# Patient Record
Sex: Female | Born: 1990 | Race: Black or African American | Hispanic: Yes | Marital: Single | Smoking: Former smoker
Health system: Southern US, Community
[De-identification: ages and names within clinical notes are randomized; demographics above are authoritative.]

## PROBLEM LIST (undated history)

## (undated) DIAGNOSIS — O99340 Other mental disorders complicating pregnancy, unspecified trimester: Secondary | ICD-10-CM

## (undated) DIAGNOSIS — F329 Major depressive disorder, single episode, unspecified: Secondary | ICD-10-CM

## (undated) DIAGNOSIS — F32A Depression, unspecified: Secondary | ICD-10-CM

## (undated) DIAGNOSIS — J45909 Unspecified asthma, uncomplicated: Secondary | ICD-10-CM

## (undated) DIAGNOSIS — E669 Obesity, unspecified: Secondary | ICD-10-CM

---
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
## 2013-03-27 ENCOUNTER — Emergency Department: Payer: Self-pay | Admitting: Emergency Medicine

## 2013-03-27 LAB — URINALYSIS, COMPLETE
Bacteria: NONE SEEN
Bilirubin,UR: NEGATIVE
Glucose,UR: NEGATIVE mg/dL (ref 0–75)
Ketone: NEGATIVE
Leukocyte Esterase: NEGATIVE
Nitrite: NEGATIVE
Ph: 7 (ref 4.5–8.0)
Protein: NEGATIVE
WBC UR: 1 /HPF (ref 0–5)

## 2013-03-28 LAB — GC/CHLAMYDIA PROBE AMP

## 2013-06-04 ENCOUNTER — Emergency Department: Payer: Self-pay | Admitting: Emergency Medicine

## 2013-06-04 LAB — URINALYSIS, COMPLETE
Bilirubin,UR: NEGATIVE
Glucose,UR: NEGATIVE mg/dL (ref 0–75)
Nitrite: NEGATIVE
Ph: 6 (ref 4.5–8.0)
Protein: NEGATIVE
Squamous Epithelial: NONE SEEN

## 2013-06-04 LAB — GC/CHLAMYDIA PROBE AMP

## 2015-01-10 ENCOUNTER — Emergency Department: Payer: Self-pay

## 2015-01-10 DIAGNOSIS — R05 Cough: Secondary | ICD-10-CM | POA: Insufficient documentation

## 2015-01-10 DIAGNOSIS — R062 Wheezing: Secondary | ICD-10-CM | POA: Insufficient documentation

## 2015-01-10 NOTE — ED Notes (Signed)
Pt to ED c/o cough for a "couple of weeks." pt presents with crackles in lung sounds.

## 2015-01-11 ENCOUNTER — Emergency Department
Admission: EM | Admit: 2015-01-11 | Discharge: 2015-01-11 | Disposition: A | Discharge status: Home / Self Care | Payer: Self-pay | Attending: Emergency Medicine | Admitting: Emergency Medicine

## 2015-01-11 DIAGNOSIS — R05 Cough: Secondary | ICD-10-CM

## 2015-01-11 DIAGNOSIS — R059 Cough, unspecified: Secondary | ICD-10-CM

## 2015-01-11 MED ORDER — ALBUTEROL SULFATE HFA 108 (90 BASE) MCG/ACT IN AERS: 2.0000 | INHALATION_SPRAY | Freq: Four times a day (QID) | RESPIRATORY_TRACT | Status: DC | PRN

## 2015-01-11 MED ORDER — BENZONATATE 100 MG PO CAPS: 100.0000 mg | ORAL_CAPSULE | Freq: Four times a day (QID) | ORAL | Status: AC | PRN

## 2015-01-11 MED ORDER — ALBUTEROL SULFATE (2.5 MG/3ML) 0.083% IN NEBU: 2.5000 mg | INHALATION_SOLUTION | Freq: Four times a day (QID) | RESPIRATORY_TRACT | Status: DC | PRN

## 2015-01-11 MED ORDER — BENZONATATE 100 MG PO CAPS: 100.0000 mg | ORAL_CAPSULE | Freq: Four times a day (QID) | ORAL | Status: DC | PRN

## 2015-01-11 NOTE — Discharge Instructions (Signed)
Please seek medical attention for any high fevers, chest pain, shortness of breath, bloody cough, or any other new or concerning symptoms  Cough, Adult  A cough is a reflex that helps clear your throat and airways. It can help heal the body or may be a reaction to an irritated airway. A cough may only last 2 or 3 weeks (acute) or may last more than 8 weeks (chronic).  CAUSES Acute cough:  Viral or bacterial infections. Chronic cough:  Infections.  Allergies.  Asthma.  Post-nasal drip.  Smoking.  Heartburn or acid reflux.  Some medicines.  Chronic lung problems (COPD).  Cancer. SYMPTOMS   Cough.  Fever.  Chest pain.  Increased breathing rate.  High-pitched whistling sound when breathing (wheezing).  Colored mucus that you cough up (sputum). TREATMENT   A bacterial cough may be treated with antibiotic medicine.  A viral cough must run its course and will not respond to antibiotics.  Your caregiver may recommend other treatments if you have a chronic cough. HOME CARE INSTRUCTIONS   Only take over-the-counter or prescription medicines for pain, discomfort, or fever as directed by your caregiver. Use cough suppressants only as directed by your caregiver.  Use a cold steam vaporizer or humidifier in your bedroom or home to help loosen secretions.  Sleep in a semi-upright position if your cough is worse at night.  Rest as needed.  Stop smoking if you smoke. SEEK IMMEDIATE MEDICAL CARE IF:   You have pus in your sputum.  Your cough starts to worsen.  You cannot control your cough with suppressants and are losing sleep.  You begin coughing up blood.  You have difficulty breathing.  You develop pain which is getting worse or is uncontrolled with medicine.  You have a fever. MAKE SURE YOU:   Understand these instructions.  Will watch your condition.  Will get help right away if you are not doing well or get worse. Document Released: 11/20/2010  Document Revised: 08/16/2011 Document Reviewed: 11/20/2010 [facility] Patient Information 2015 [place], [place]. This information is not intended to replace advice given to you by your health care provider. Make sure you discuss any questions you have with your health care provider.

## 2015-01-11 NOTE — ED Provider Notes (Signed)
[facility] Emergency Department Provider Note   ____________________________________________  Time seen: 0205  I have reviewed the triage vital signs and the nursing notes.   HISTORY  Chief Complaint Cough   History limited by: Not Limited   HPI Brandi Solomon is a 24 y.o. female who presents to the emergency department today with continued cough. Patient states she has been having a cough for 2 weeks. She states she is bringing up yellow phlegm. She denies any significant chest pain associated with cough. Denies any significant shortness of breath. Patient does state that she is a smoker. Not had any fevers.   No past medical history on file.  There are no active problems to display for this patient.   No past surgical history on file.  No current outpatient prescriptions on file.  Allergies Review of patient's allergies indicates no known allergies.  No family history on file.  Social History Positive for smoking  Review of Systems  Constitutional: Negative for fever. Cardiovascular: Negative for chest pain. Respiratory: Negative for shortness of breath. Positive for cough Gastrointestinal: Negative for abdominal pain, vomiting and diarrhea. Genitourinary: Negative for dysuria. Musculoskeletal: Negative for back pain. Skin: Negative for rash. Neurological: Negative for headaches, focal weakness or numbness.   10-point ROS otherwise negative.  ____________________________________________   PHYSICAL EXAM:  VITAL SIGNS: ED Triage Vitals  Enc Vitals Group     BP 01/10/15 2221 138/88 mmHg     Pulse Rate 01/10/15 2221 92     Resp 01/10/15 2221 20     Temp 01/10/15 2221 98.7 F (37.1 C)     Temp Source 01/10/15 2221 Oral     SpO2 01/10/15 2221 97 %     Weight 01/10/15 2221 180 lb (81.647 kg)     Height 01/10/15 2221 5\' 11"  (1.803 m)     Head Cir --      Peak Flow --      Pain Score 01/11/15 0200 8   Constitutional: Alert  and oriented. Well appearing and in no distress. Eyes: Conjunctivae are normal. PERRL. Normal extraocular movements. ENT   Head: Normocephalic and atraumatic.   Nose: No congestion/rhinnorhea.   Mouth/Throat: Mucous membranes are moist.   Neck: No stridor. Hematological/Lymphatic/Immunilogical: No cervical lymphadenopathy. Cardiovascular: Normal rate, regular rhythm.  No murmurs, rubs, or gallops. Respiratory: Normal respiratory effort without tachypnea nor retractions. Patient does have very minimal bilateral expiratory wheeze. Gastrointestinal: Soft and nontender. No distention.  Genitourinary: Deferred Musculoskeletal: Normal range of motion in all extremities. No joint effusions.  No lower extremity tenderness nor edema. Neurologic:  Normal speech and language. No gross focal neurologic deficits are appreciated. Speech is normal.  Skin:  Skin is warm, dry and intact. No rash noted. Psychiatric: Mood and affect are normal. Speech and behavior are normal. Patient exhibits appropriate insight and judgment.  ____________________________________________    LABS (pertinent positives/negatives)  None  ____________________________________________   EKG  None  ____________________________________________    RADIOLOGY  CXR  IMPRESSION: No active cardiopulmonary disease.  ____________________________________________   PROCEDURES  Procedure(s) performed: None  Critical Care performed: No  ____________________________________________   INITIAL IMPRESSION / ASSESSMENT AND PLAN / ED COURSE  Pertinent labs & imaging results that were available during my care of the patient were reviewed by me and considered in my medical decision making (see chart for details).  Patient presents to the emergency department today with cough for 2 weeks. Chest x-ray without any concerning findings. Patient afebrile. I think likely patient  is suffering from bronchitis. Patient  is a smoker. I did discuss with the patient smoking cessation. Will give prescription for albuterol and Tessalon Perles. Discussed return precautions.  ____________________________________________   FINAL CLINICAL IMPRESSION(S) / ED DIAGNOSES  Final diagnoses:  Cough     Phineas Semen, MD 01/11/15 [phone]

## 2015-07-19 ENCOUNTER — Emergency Department (HOSPITAL_COMMUNITY): Payer: Self-pay

## 2015-07-19 ENCOUNTER — Emergency Department (HOSPITAL_COMMUNITY)
Admission: EM | Admit: 2015-07-19 | Discharge: 2015-07-19 | Disposition: A | Discharge status: Home / Self Care | Payer: Self-pay | Attending: Emergency Medicine | Admitting: Emergency Medicine

## 2015-07-19 ENCOUNTER — Encounter (HOSPITAL_COMMUNITY): Payer: Self-pay | Admitting: Emergency Medicine

## 2015-07-19 DIAGNOSIS — H5712 Ocular pain, left eye: Secondary | ICD-10-CM | POA: Insufficient documentation

## 2015-07-19 DIAGNOSIS — F17200 Nicotine dependence, unspecified, uncomplicated: Secondary | ICD-10-CM | POA: Insufficient documentation

## 2015-07-19 DIAGNOSIS — J45909 Unspecified asthma, uncomplicated: Secondary | ICD-10-CM | POA: Insufficient documentation

## 2015-07-19 DIAGNOSIS — R059 Cough, unspecified: Secondary | ICD-10-CM

## 2015-07-19 DIAGNOSIS — R05 Cough: Secondary | ICD-10-CM | POA: Insufficient documentation

## 2015-07-19 HISTORY — DX: Unspecified asthma, uncomplicated: J45.909

## 2015-07-19 MED ORDER — BENZONATATE 100 MG PO CAPS
200.0000 mg | ORAL_CAPSULE | Freq: Once | ORAL | Status: AC
  Administered 2015-07-19: 200 mg via ORAL
  Filled 2015-07-19: qty 2

## 2015-07-19 MED ORDER — FLUORESCEIN SODIUM 1 MG OP STRP
1.0000 | ORAL_STRIP | Freq: Once | OPHTHALMIC | Status: AC
  Administered 2015-07-19: 1 via OPHTHALMIC
  Filled 2015-07-19: qty 1

## 2015-07-19 MED ORDER — ALBUTEROL SULFATE HFA 108 (90 BASE) MCG/ACT IN AERS
2.0000 | INHALATION_SPRAY | Freq: Once | RESPIRATORY_TRACT | Status: AC | PRN
  Administered 2015-07-19: 2 via RESPIRATORY_TRACT
  Filled 2015-07-19: qty 6.7

## 2015-07-19 MED ORDER — ERYTHROMYCIN 5 MG/GM OP OINT: TOPICAL_OINTMENT | OPHTHALMIC | Status: DC

## 2015-07-19 MED ORDER — TETRACAINE HCL 0.5 % OP SOLN
2.0000 [drp] | Freq: Once | OPHTHALMIC | Status: AC
  Administered 2015-07-19: 2 [drp] via OPHTHALMIC
  Filled 2015-07-19: qty 2

## 2015-07-19 MED ORDER — ALBUTEROL SULFATE HFA 108 (90 BASE) MCG/ACT IN AERS: 2.0000 | INHALATION_SPRAY | RESPIRATORY_TRACT | Status: DC | PRN

## 2015-07-19 MED ORDER — ERYTHROMYCIN 5 MG/GM OP OINT
1.0000 "application " | TOPICAL_OINTMENT | Freq: Once | OPHTHALMIC | Status: AC
  Administered 2015-07-19: 1 via OPHTHALMIC
  Filled 2015-07-19: qty 3.5

## 2015-07-19 NOTE — Discharge Instructions (Signed)
You have been seen today for eye pain and a cough. Your imaging showed no abnormalities. Follow-up with ophthalmology as soon as possible. Follow up with PCP as needed. Return to ED should symptoms worsen.   Emergency Department Resource Guide 1) Find a Doctor and Pay Out of Pocket Although you won't have to find out who is covered by your insurance plan, it is a good idea to ask around and get recommendations. You will then need to call the office and see if the doctor you have chosen will accept you as a new patient and what types of options they offer for patients who are self-pay. Some doctors offer discounts or will set up payment plans for their patients who do not have insurance, but you will need to ask so you aren't surprised when you get to your appointment.  2) Contact Your Local Health Department Not all health departments have doctors that can see patients for sick visits, but many do, so it is worth a call to see if yours does. If you don't know where your local health department is, you can check in your phone book. The CDC also has a tool to help you locate your state's health department, and many state websites also have listings of all of their local health departments.  3) Find a Walk-in Clinic If your illness is not likely to be very severe or complicated, you may want to try a walk in clinic. These are popping up all over the country in pharmacies, drugstores, and shopping centers. They're usually staffed by nurse practitioners or physician assistants that have been trained to treat common illnesses and complaints. They're usually fairly quick and inexpensive. However, if you have serious medical issues or chronic medical problems, these are probably not your best option.  No Primary Care Doctor: - Call Health Connect at  [phone] - they can help you locate a primary care doctor that  accepts your insurance, provides certain services, etc. - Physician Referral Service-  [phone]  Chronic Pain Problems: Organization         Address  Phone   Notes  [facility]  [phone] Patients need to be referred by their primary care doctor.   Medication Assistance: Organization         Address  Phone   Notes  [facility] Medication [facility] [address] [place] [place], [place] [address] [phone] --Must be a resident of [facility] -- Must have NO insurance coverage whatsoever (no Medicaid/ Medicare, etc.) -- The pt. MUST have a primary care doctor that directs their care regularly and follows them in the community   [facility]ist  [phone]   [employer]  [phone]    Agencies that provide inexpensive medical care: Organization         Address  Phone   Notes  [facility]  [phone]   [facility]    [phone]   [facility] [address] [place], [place] [address] [phone]   [facility] [address] [place] [place] [phone]   Planned Parenthood    [phone]   [facility]    [phone]   [facility]  [address], [place] Phone:  [phone], Fax:  [phone] Hours of Operation:  9 am - 6 pm, M-F.  Also accepts Medicaid/Medicare and self-pay.  [facility]  [address], [place] Phone: [phone], Fax: [phone]. Hours of Operation:  8:30 am - 5:30 pm, M-F.  Also accepts Medicaid and self-pay.  [facility] [place] [address], [place] Phone: [phone]   [place], [place], [place] [phone], Ext. [phone] Mondays & Thursdays: 7-9 AM.  First 15 patients are seen on a first come, first serve basis.    [place] Providers:  Organization         Address  Phone   Notes  [facility] [address],  [place] [phone] Also accepts self-pay patients.  [facility] [address], [place]  [phone]   [place], [address], [place] [phone]   [facility] [address] [place] [phone]   Lucianne Lei [address], [place]   [phone] Only accepts [place] Access [place] patients after they have their name applied to their card.   Self-Pay (no insurance) in [facility]:  Organization         Address  Phone   Notes  Sickle Cell Patients, [facility] Internal Medicine [place] [phone]   [facility] Urgent Care [place] [phone]   [facility] [place]  [place], [place], [place] [phone]   [facility]/Dr. Osei-Bonsu  [address], [place] or [address], [place] [phone] Phone number for both [place] and [place] locations is the same.  [facility]gent [facility] and [facility] [address], [place] [phone]   [facility] [address] [place] or [address] [phone]   [facility] [address], [place] [phone], phone; [phone], fax Sees patients 1st and 3rd Saturday of every month.  Must not qualify for public or private insurance (i.e. Medicaid, Medicare, [place] Health Choice, Veterans' Benefits)  Household income should be no more than 200% of the poverty level The clinic cannot treat you if you are pregnant or think you are pregnant  Sexually transmitted diseases are not treated at the clinic.    Dental Care: Organization         Address  Phone  Notes  [facility] Department of [facility] [phone] Accepts children up to age 29 who are enrolled in  [place] or [place]; pregnant women with a Medicaid card; and children who have applied for Medicaid or [place] Health Choice, but were declined, whose parents can pay a reduced fee at time of service.  [facility] Department of [facility]  [address], [place] [phone] Accepts children up to age 36 who are enrolled in [place] or [place]; pregnant women with a Medicaid card; and children who have applied for Medicaid or [place] Health Choice, but were declined, whose parents can pay a reduced fee at time of service.  [facility] Adult [facility] Access PROGRAM  [place] [phone] Patients are seen by appointment only. Walk-ins are not accepted. [place] will see patients 66 years of age and older. Monday - Tuesday (8am-5pm) Most Wednesdays (8:30-5pm) $30 per visit, cash only  [place] [facility]  [address], [facility] [phone] Patients are seen by appointment only. Walk-ins are not accepted. [place] will see patients 55 years of age and older. One Wednesday Evening (Monthly: Volunteer Based).  $30 per visit, cash only  [place]  [phone] for adults; Children under age 16, call Graduate Pediatric Dentistry at [phone]. Children aged 15-14, please call [phone] to request a pediatric application.  Dental services are provided in all areas of dental care including fillings, crowns and bridges, complete and partial dentures, implants, gum treatment, root canals, and extractions. Preventive care is also provided. Treatment is provided to both adults and children. Patients are selected via a lottery and there is often a waiting list.   [facility] [address] [place]  [phone] [email or web address]   [facility] [address] [place], [place] [phone], Ext. [phone] Second and Fourth Thursday of each month, opens at 6:30  AM; Clinic ends at 9 AM.  Patients are seen on a first-come first-served basis, and a limited number are seen during each clinic.   [facility]  [address] Hillard Danker [place], [place] [phone]   Eligibility Requirements You must have lived in [place], [place], or [place] counties for at least the last three months.   You cannot be eligible for state or federal sponsored [employer], including [employer], [place], or [employer].   You generally cannot be eligible for healthcare insurance through your employer.    How to apply: Eligibility screenings are held every Tuesday and Wednesday afternoon from 1:00 pm until 4:00 pm. You do not need an appointment for the interview!  [facility] [address] [place], [place]   [employer]  [place] Department  [place]  [phone]    Behavioral Health Resources in the Community: Intensive Outpatient Programs Organization         Address  Phone  Notes  [place]. [address], [place], [place] [phone]   [facility] [address], [place], [employer]   ADS: [facility] & Drug Sv[facility] [address], [place], [place]   [place] [address]  [place], [place] or [phone]   Substance Abuse Resources Organization         Address  Phone  Notes  Alcohol and Drug Services  [phone]   [place]  [phone]   [facility] [place]   Chinita Pester  [phone]   Residential & Outpatient Substance Abuse Program  [phone]   Psychological Services Organization         Address  Phone  Notes  [facility] [place]  [place]  [phone]   [place] [address] [place] [phone] or  [phone]    Mobile Crisis Teams Organization         Address  Phone  Notes  [facility], Mobile Crisis Care Unit  [phone]   [facility]  [address] [place], [place]   [place] [address] [place] [phone]    Self-Help/Support Groups Organization         Address  Phone             Notes  [facility]ental Health Assoc. of [place] - variety of support groups  [place] Call for more information  Narcotics Anonymous (NA), [facility] [address], [employer] [place]  2 meetings at this location   Special educational needs teacher         Address  Phone  Notes  [facility] [place],    [place]  [phone]   [facility]  [address], [place] [address], [place], [place]   [place], [place] [phone] Admissions: 8am-3pm M-F  Incentives Substance [place] [address],    [place], [place] [phone]   [facility] [address] [place], [place], [place]   [facility] [address],  [place], [place]   [facility] - Intensive Outpatient [address], [place], [place]   [facility] ([place].) [address].,  [place], [place] [phone] or [phone]   [facility] (RTS) [address] [place], [place] Accepts Medicaid  [facility] [place] [address]  [place] [phone] Substance Abuse/Addiction Treatment   [facility] Organization         Address  Phone  Notes  [facility]  [phone]   Domenic Schwab, PhD [address] Arlis Porta [place], [place]   [phone] or [phone]   [place] Percy [place], [place] [phone]   [facility] [address],  [place], [place] [phone] Insurance/Medicaid/sponsorship through [facility] and Families [address]                                    [place], [place] [phone] [place] [address], [place] [phone]    Dr. Adele Schilder  [phone]   [facility] Dept. 1) [address] [place] 2) [place] 3)  [place] [address], [place] [phone]  [phone]   [place] [phone] or [phone] (After Hours)

## 2015-07-19 NOTE — ED Provider Notes (Signed)
CSN: [id number]     Arrival date & time 07/19/15  2013 History   First MD Initiated Contact with Patient 07/19/15 2043     Chief Complaint  Patient presents with  . Eye Pain  . Asthma  . Cough     (Consider location/radiation/quality/duration/timing/severity/associated sxs/prior Treatment) HPI   Brandi Solomon is a 25 y.o. female, with a history of asthma, presenting to the ED with left eye irritation since yesterday and a cough for the last three weeks. Pt states she is really only here for clearance back to work because her boss saw that her eye was red and sent her home. She states, "yesterday it felt like something was in my eye." Patient states that she no longer has the sensation of a foreign body. Patient denies drainage from her eye, pain, or neuro deficits. Pt has been using her inhaler about twice a day since she started coughing and this has been helping her breathing. Pt states she is new to having asthma and is still learning what her triggers are. She denies chest pain, shortness of breath, nausea/vomiting, or any other complaints.  Past Medical History  Diagnosis Date  . Asthma    Past Surgical History  Procedure Laterality Date  . Cesarean section     History reviewed. No pertinent family history. Social History  Substance Use Topics  . Smoking status: Current Every Day Smoker  . Smokeless tobacco: None  . Alcohol Use: Yes   OB History    No data available     Review of Systems  Constitutional: Negative for fever and chills.  Eyes: Positive for redness and itching. Negative for photophobia, pain, discharge and visual disturbance.  Respiratory: Positive for cough. Negative for shortness of breath.   Cardiovascular: Negative for chest pain.  All other systems reviewed and are negative.     Allergies  Review of patient's allergies indicates no known allergies.  Home Medications   Prior to Admission medications   Medication Sig Start Date End Date  Taking? Authorizing Provider  albuterol (PROVENTIL HFA;VENTOLIN HFA) 108 (90 BASE) MCG/ACT inhaler Inhale 2 puffs into the lungs every 6 (six) hours as needed for wheezing or shortness of breath. 01/11/15   Phineas Semen, MD  albuterol (PROVENTIL HFA;VENTOLIN HFA) 108 (90 Base) MCG/ACT inhaler Inhale 2 puffs into the lungs every 4 (four) hours as needed for wheezing or shortness of breath. 07/19/15   Fedora Knisely C Dezzie Badilla, PA-C  albuterol (PROVENTIL) (2.5 MG/3ML) 0.083% nebulizer solution Take 3 mLs (2.5 mg total) by nebulization every 6 (six) hours as needed for wheezing or shortness of breath. 01/11/15   Phineas Semen, MD  benzonatate (TESSALON PERLES) 100 MG capsule Take 1 capsule (100 mg total) by mouth every 6 (six) hours as needed for cough. 01/11/15 01/11/16  Phineas Semen, MD  erythromycin ophthalmic ointment Place a 1/2 inch ribbon of ointment into the lower eyelid. Apply 4 times a day for the next 3 days. 07/19/15   Michaeljohn Biss C Porter Nakama, PA-C   BP 120/72 mmHg  Pulse 89  Temp(Src) 98 F (36.7 C) (Oral)  Resp 16  Ht  (1.778 m)  Wt 97.523 kg  BMI 30.85 kg/m2  SpO2 100%  LMP 06/11/2015 (Exact Date) Physical Exam  Constitutional: She appears well-developed and well-nourished. No distress.  HENT:  Head: Normocephalic and atraumatic.  Eyes: Conjunctivae and EOM are normal. Pupils are equal, round, and reactive to light.  No scleral injection. No tearing or discharge noted. No contact lenses in  place. Woods Lamp exam shows tiny area of increased fluorescein take at the 10:00 position of the sclera on the left eye.  Slit lamp was not available to use on this patient. Tono-Pen values: Right eye: 18  Left eye: 17    Visual Acuity  Right Eye Distance: 20/20 -1 Left Eye Distance: 20/25 Bilateral Distance: 20/25  Right Eye Near:   Left Eye Near:    Bilateral Near:     Neck: Neck supple.  Cardiovascular: Normal rate, regular rhythm, normal heart sounds and intact distal pulses.   Pulmonary/Chest:  Effort normal and breath sounds normal. No respiratory distress.  Normal work of breathing. Speaks in full sentences without difficulty.  Abdominal: Soft. Bowel sounds are normal. There is no tenderness. There is no guarding.  Musculoskeletal: She exhibits no edema or tenderness.  Lymphadenopathy:    She has no cervical adenopathy.  Neurological: She is alert.  No sensory deficits. No facial droop.  Skin: Skin is warm and dry. She is not diaphoretic.  Nursing note and vitals reviewed.   ED Course  Procedures (including critical care time) Labs Review Labs Reviewed - No data to display  Imaging Review Dg Chest 2 View  07/19/2015  CLINICAL DATA:  25 year old current history of asthma presenting with cough and chest tightness over the past 3 weeks. Patient uses an albuterol inhaler daily. Acute left eye irritation which began yesterday. EXAM: CHEST  2 VIEW COMPARISON:  01/10/2015. FINDINGS: Cardiomediastinal silhouette unremarkable. Lungs clear. Bronchovascular markings normal. Pulmonary vascularity normal. No pneumothorax. No pleural effusions. Visualized bony thorax intact. No significant interval change. IMPRESSION: Normal and stable examination. Electronically Signed   By: Hulan Saas M.D.   On: 07/19/2015 21:16   I have personally reviewed and evaluated these images and lab results as part of my medical decision-making.   EKG Interpretation None         Visual Acuity  Right Eye Distance: 20/20 -1 Left Eye Distance: 20/25 Bilateral Distance: 20/25  Right Eye Near:   Left Eye Near:    Bilateral Near:     MDM   Final diagnoses:  Eye pain, left  Cough    Bill Estis presents with complaints of eye redness and itching since yesterday and a nonproductive cough for the last 3 weeks.  Patient is nontoxic appearing, afebrile, not tachycardic, not tachypneic, maintains SPO2 of 99-100% on room air, and is in no apparent distress. Patient has no signs of sepsis or other  serious or life-threatening condition. Patient had no serious abnormalities on her physical exam. Patient does not show signs of an asthma attack. Patient does not show signs of conjunctivitis. Patient may have a very tiny corneal abrasion. This is not affecting her vision and the patient is not currently aware of it. Patient to follow-up with ophthalmology outpatient. Patient was given ophthalmic erythromycin. Pt was offered a breathing treatment, but declined. Patient's cough treated. The patient was given instructions for home care as well as return precautions. Patient voices understanding of these instructions, accepts the plan, and is comfortable with discharge.  Filed Vitals:   07/19/15 2031 07/19/15 2222  BP: 141/98 120/72  Pulse: 98 89  Temp: 98.6 F (37 C) 98 F (36.7 C)  TempSrc: Oral   Resp: 16 16  Height:  (1.778 m)   Weight: 97.523 kg   SpO2: 99% 100%       Anselm Pancoast, PA-C 07/19/15 2259  Benjiman Core, MD 07/20/15 1500

## 2015-07-19 NOTE — ED Notes (Signed)
Patient here with complaint of left eye irritation which was present yesterday and persistent chest tightness for 3 weeks. Presents primarily for clearance for work as she works in [employer] and she was sent home when the eye irritation was noticed. Eye currently appears similar to counterpart. Patient explains that chest tightness is related to her asthma; currently patient uses albuterol inhaler daily.

## 2015-09-04 ENCOUNTER — Encounter (HOSPITAL_COMMUNITY): Payer: Self-pay | Admitting: Emergency Medicine

## 2015-09-04 ENCOUNTER — Emergency Department (HOSPITAL_COMMUNITY)
Admission: EM | Admit: 2015-09-04 | Discharge: 2015-09-04 | Disposition: A | Discharge status: Home / Self Care | Payer: Self-pay | Attending: Emergency Medicine | Admitting: Emergency Medicine

## 2015-09-04 DIAGNOSIS — R1032 Left lower quadrant pain: Secondary | ICD-10-CM | POA: Insufficient documentation

## 2015-09-04 DIAGNOSIS — Z3202 Encounter for pregnancy test, result negative: Secondary | ICD-10-CM | POA: Insufficient documentation

## 2015-09-04 DIAGNOSIS — J45909 Unspecified asthma, uncomplicated: Secondary | ICD-10-CM | POA: Insufficient documentation

## 2015-09-04 DIAGNOSIS — F17200 Nicotine dependence, unspecified, uncomplicated: Secondary | ICD-10-CM | POA: Insufficient documentation

## 2015-09-04 DIAGNOSIS — R1031 Right lower quadrant pain: Secondary | ICD-10-CM | POA: Insufficient documentation

## 2015-09-04 DIAGNOSIS — R110 Nausea: Secondary | ICD-10-CM | POA: Insufficient documentation

## 2015-09-04 DIAGNOSIS — R1030 Lower abdominal pain, unspecified: Secondary | ICD-10-CM

## 2015-09-04 DIAGNOSIS — Z79899 Other long term (current) drug therapy: Secondary | ICD-10-CM | POA: Insufficient documentation

## 2015-09-04 LAB — COMPREHENSIVE METABOLIC PANEL
ALT: 17 U/L (ref 14–54)
AST: 18 U/L (ref 15–41)
Albumin: 3.8 g/dL (ref 3.5–5.0)
Alkaline Phosphatase: 60 U/L (ref 38–126)
Anion gap: 6 (ref 5–15)
BUN: 17 mg/dL (ref 6–20)
CALCIUM: 9.3 mg/dL (ref 8.9–10.3)
CO2: 23 mmol/L (ref 22–32)
CREATININE: 0.78 mg/dL (ref 0.44–1.00)
Chloride: 109 mmol/L (ref 101–111)
GFR calc Af Amer: >60 mL/min (ref >60)
GFR calc non Af Amer: >60 mL/min (ref >60)
Glucose, Bld: 87 mg/dL (ref 65–99)
Potassium: 3.8 mmol/L (ref 3.5–5.1)
Sodium: 138 mmol/L (ref 135–145)
Total Bilirubin: 0.5 mg/dL (ref 0.3–1.2)
Total Protein: 7 g/dL (ref 6.5–8.1)

## 2015-09-04 LAB — POC URINE PREG, ED: PREG TEST UR: NEGATIVE

## 2015-09-04 LAB — CBC
HCT: 38.5 % (ref 36.0–46.0)
Hemoglobin: 12.7 g/dL (ref 12.0–15.0)
MCH: 29.5 pg (ref 26.0–34.0)
MCHC: 33 g/dL (ref 30.0–36.0)
MCV: 89.5 fL (ref 78.0–100.0)
Platelets: 267 10*3/uL (ref 150–400)
RBC: 4.3 MIL/uL (ref 3.87–5.11)
RDW: 12.6 % (ref 11.5–15.5)
WBC: 7.8 10*3/uL (ref 4.0–10.5)

## 2015-09-04 LAB — URINALYSIS, ROUTINE W REFLEX MICROSCOPIC
Bilirubin Urine: NEGATIVE
Glucose, UA: NEGATIVE mg/dL
HGB URINE DIPSTICK: NEGATIVE
Ketones, ur: NEGATIVE mg/dL
LEUKOCYTES UA: NEGATIVE
Nitrite: NEGATIVE
PROTEIN: NEGATIVE mg/dL
Specific Gravity, Urine: 1.025 (ref 1.005–1.030)
pH: 6.5 (ref 5.0–8.0)

## 2015-09-04 LAB — LIPASE, BLOOD: Lipase: 35 U/L (ref 11–51)

## 2015-09-04 NOTE — ED Provider Notes (Signed)
CSN: [id number]     Arrival date & time 09/04/15  1938 History   First MD Initiated Contact with Patient 09/04/15 2148     Chief Complaint  Patient presents with  . Abdominal Pain     (Consider location/radiation/quality/duration/timing/severity/associated sxs/prior Treatment) HPI Comments: 25 year old female who presents with abdominal pain. The patient reports a one-week history of intermittent lower abdominal pain across her lower abdomen. The pain seems to be worse when she stands up and lifts heavy objects at work. She has not noticed any bulge or swelling in her lower abdomen. She reports associated nausea but no vomiting, diarrhea, fevers, chills, urinary symptoms, vaginal bleeding, or vaginal discharge. She is currently sexually active with one partner.  Patient is a 25 y.o. female presenting with abdominal pain. The history is provided by the patient.  Abdominal Pain   Past Medical History  Diagnosis Date  . Asthma    Past Surgical History  Procedure Laterality Date  . Cesarean section     No family history on file. Social History  Substance Use Topics  . Smoking status: Current Every Day Smoker  . Smokeless tobacco: None  . Alcohol Use: Yes   OB History    No data available     Review of Systems  Gastrointestinal: Positive for abdominal pain.   10 Systems reviewed and are negative for acute change except as noted in the HPI.    Allergies  Review of patient's allergies indicates no known allergies.  Home Medications   Prior to Admission medications   Medication Sig Start Date End Date Taking? Authorizing Provider  albuterol (PROVENTIL HFA;VENTOLIN HFA) 108 (90 BASE) MCG/ACT inhaler Inhale 2 puffs into the lungs every 6 (six) hours as needed for wheezing or shortness of breath. 01/11/15  Yes Phineas SemenGraydon Goodman, MD  albuterol (PROVENTIL) (2.5 MG/3ML) 0.083% nebulizer solution Take 3 mLs (2.5 mg total) by nebulization every 6 (six) hours as needed for wheezing or  shortness of breath. 01/11/15  Yes Phineas SemenGraydon Goodman, MD  albuterol (PROVENTIL HFA;VENTOLIN HFA) 108 (90 Base) MCG/ACT inhaler Inhale 2 puffs into the lungs every 4 (four) hours as needed for wheezing or shortness of breath. 07/19/15   Shawn C Joy, PA-C  benzonatate (TESSALON PERLES) 100 MG capsule Take 1 capsule (100 mg total) by mouth every 6 (six) hours as needed for cough. 01/11/15 01/11/16  Phineas SemenGraydon Goodman, MD  erythromycin ophthalmic ointment Place a 1/2 inch ribbon of ointment into the lower eyelid. Apply 4 times a day for the next 3 days. 07/19/15   Shawn C Joy, PA-C   BP 120/86 mmHg  Pulse 84  Temp(Src) 98.6 F (37 C) (Oral)  Resp 15  Ht 5\' 11"  (1.803 m)  Wt 215 lb (97.523 kg)  BMI 30.00 kg/m2  SpO2 100%  LMP  Physical Exam  Constitutional: She is oriented to person, place, and time. She appears well-developed and well-nourished. No distress.  HENT:  Head: Normocephalic and atraumatic.  Moist mucous membranes  Eyes: Conjunctivae are normal. Pupils are equal, round, and reactive to light.  Neck: Neck supple.  Cardiovascular: Normal rate, regular rhythm and normal heart sounds.   No murmur heard. Pulmonary/Chest: Effort normal and breath sounds normal.  Abdominal: Soft. Bowel sounds are normal. She exhibits no distension. There is no rebound and no guarding.  Mild tenderness across suprapubic abdomen, left and right lower quadrants  Musculoskeletal: She exhibits no edema.  Neurological: She is alert and oriented to person, place, and time.  Fluent speech  Skin: Skin is warm and dry.  Psychiatric: She has a normal mood and affect. Judgment normal.  Nursing note and vitals reviewed.   ED Course  Procedures (including critical care time) Labs Review Labs Reviewed  LIPASE, BLOOD  COMPREHENSIVE METABOLIC PANEL  CBC  URINALYSIS, ROUTINE W REFLEX MICROSCOPIC (NOT AT [facility])  POC URINE PREG, ED    Imaging Review No results found. I have personally reviewed and evaluated these lab  results as part of my medical decision-making.    MDM   Final diagnoses:  Lower abdominal pain   Patient with 1 week of intermittent lower abdominal pain without any associated vomiting, diarrhea, or vaginal discharge. She was well-appearing with normal vital signs. Mild tenderness across her lower abdomen with no focal pain. Her lab work here including UA, UPT, and CMP is unremarkable. Given the duration of her symptoms and the fact that the pain is across her lower abdomen rather than focally in her right lower quadrant, I doubt appendicitis. Her pain has been bilateral, therefore specific ovarian pathology such as ruptured ovarian cyst or ovarian torsion seems very unlikely. I recommended a pelvic exam to evaluate for gynecologic etiology. The patient politely refused and stated that she would rather follow up with an OB/GYN for evaluation. I provided her with [facility] follow-up information and emphasized importance of clinic appointments. I reviewed return precautions and the patient voiced understanding. Patient discharged in satisfactory condition.  Laurence Spates, MD 09/04/15 [id number]

## 2015-09-04 NOTE — Discharge Instructions (Signed)

## 2015-09-04 NOTE — ED Notes (Signed)
Pt. reports intermittent  low abdominal pain with nausea onset last week , denies emesis or diarrhea , no fever or chills , denies dysuria or vaginal discharge.

## 2016-04-20 IMAGING — DX DG CHEST 2V
2 series · 2 of 2 positions shown · non-contrast
Comparison: 01/10/2015.

CLINICAL DATA: 24-year-old current history of asthma presenting
with cough and chest tightness over the past 3 weeks. Patient uses
an albuterol inhaler daily. Acute left eye irritation which began
yesterday.

EXAM:
CHEST  2 VIEW

[w chest pa]
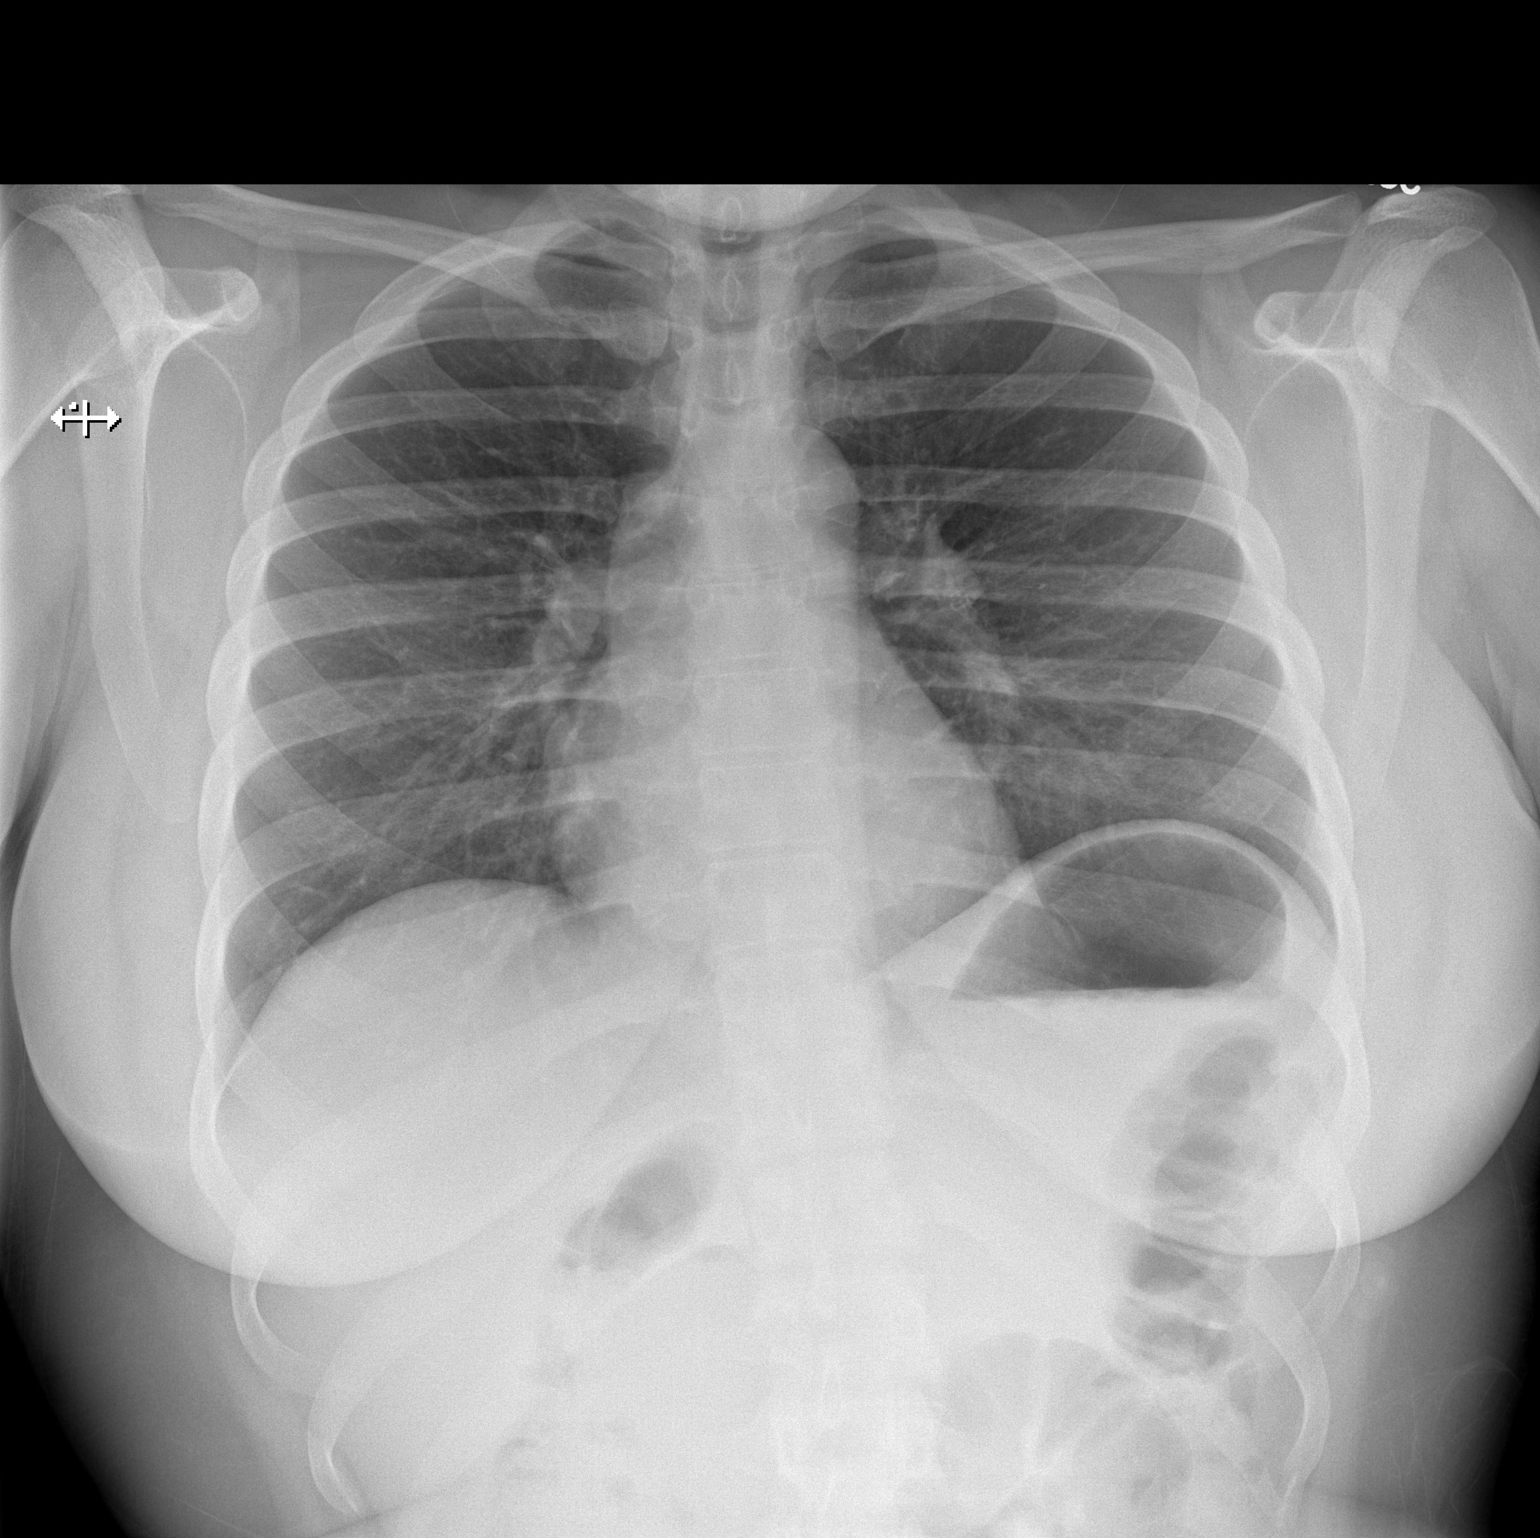

[w chest lat]
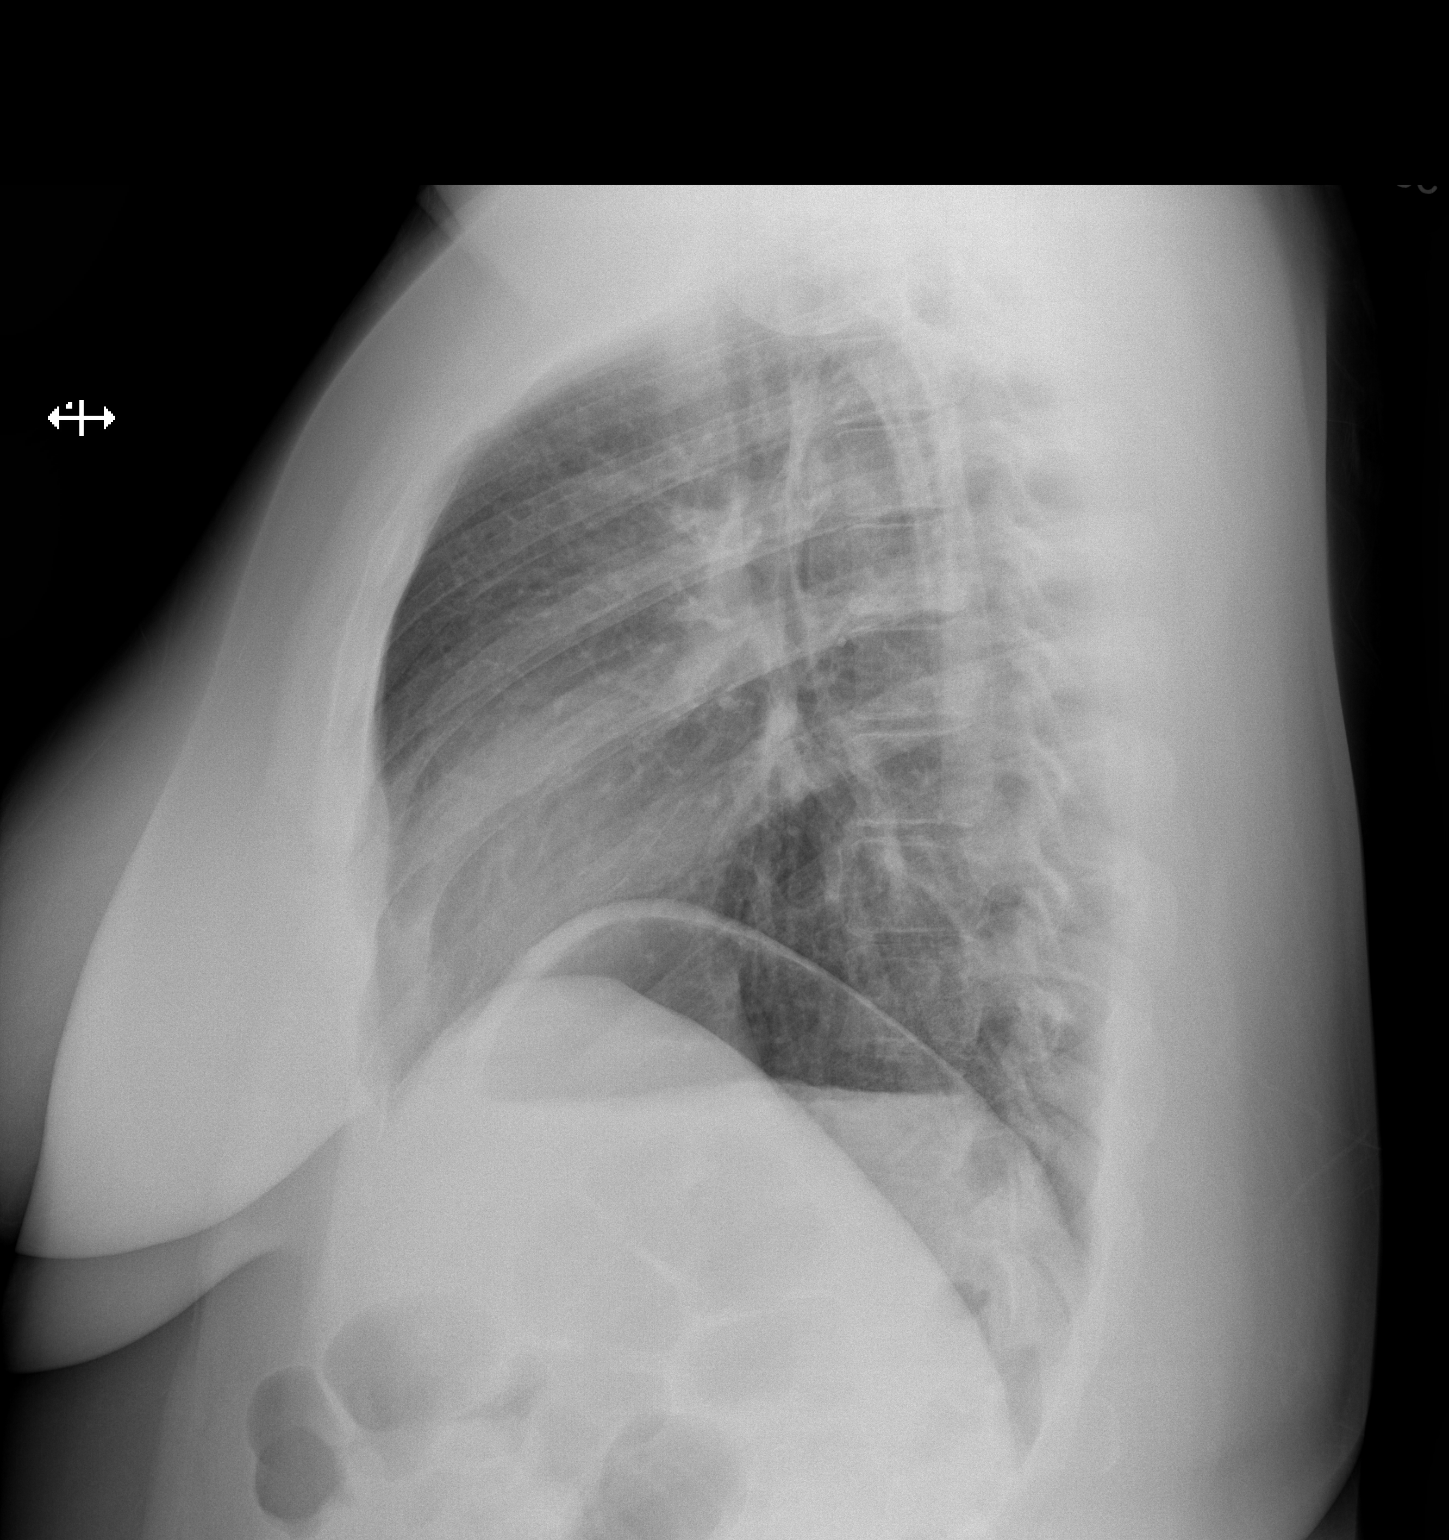

[2 of 2 positions shown; findings below may reference images not displayed]

FINDINGS: Cardiomediastinal silhouette unremarkable. Lungs clear.
Bronchovascular markings normal. Pulmonary vascularity normal. No
pneumothorax. No pleural effusions. Visualized bony thorax intact.
No significant interval change.
IMPRESSION: Normal and stable examination.

## 2016-05-17 ENCOUNTER — Emergency Department: Payer: Medicaid Other

## 2016-05-17 ENCOUNTER — Emergency Department
Admission: EM | Admit: 2016-05-17 | Discharge: 2016-05-17 | Disposition: A | Discharge status: Home / Self Care | Payer: Medicaid Other | Attending: Emergency Medicine | Admitting: Emergency Medicine

## 2016-05-17 ENCOUNTER — Encounter: Payer: Self-pay | Admitting: Emergency Medicine

## 2016-05-17 DIAGNOSIS — W010XXA Fall on same level from slipping, tripping and stumbling without subsequent striking against object, initial encounter: Secondary | ICD-10-CM | POA: Insufficient documentation

## 2016-05-17 DIAGNOSIS — J45909 Unspecified asthma, uncomplicated: Secondary | ICD-10-CM | POA: Diagnosis not present

## 2016-05-17 DIAGNOSIS — O200 Threatened abortion: Secondary | ICD-10-CM

## 2016-05-17 DIAGNOSIS — Z3A00 Weeks of gestation of pregnancy not specified: Secondary | ICD-10-CM | POA: Diagnosis not present

## 2016-05-17 DIAGNOSIS — Y939 Activity, unspecified: Secondary | ICD-10-CM | POA: Diagnosis not present

## 2016-05-17 DIAGNOSIS — Y999 Unspecified external cause status: Secondary | ICD-10-CM | POA: Insufficient documentation

## 2016-05-17 DIAGNOSIS — F17200 Nicotine dependence, unspecified, uncomplicated: Secondary | ICD-10-CM | POA: Insufficient documentation

## 2016-05-17 DIAGNOSIS — M25571 Pain in right ankle and joints of right foot: Secondary | ICD-10-CM | POA: Diagnosis not present

## 2016-05-17 DIAGNOSIS — O209 Hemorrhage in early pregnancy, unspecified: Secondary | ICD-10-CM | POA: Diagnosis present

## 2016-05-17 DIAGNOSIS — Y929 Unspecified place or not applicable: Secondary | ICD-10-CM | POA: Insufficient documentation

## 2016-05-17 LAB — CBC WITH DIFFERENTIAL/PLATELET
Basophils Absolute: 0 K/uL (ref 0–0.1)
Basophils Relative: 1 %
Eosinophils Absolute: 0.2 K/uL (ref 0–0.7)
Eosinophils Relative: 3 %
HCT: 37.1 % (ref 35.0–47.0)
Hemoglobin: 12.6 g/dL (ref 12.0–16.0)
Lymphocytes Relative: 28 %
Lymphs Abs: 2 K/uL (ref 1.0–3.6)
MCH: 30.5 pg (ref 26.0–34.0)
MCHC: 34 g/dL (ref 32.0–36.0)
MCV: 89.7 fL (ref 80.0–100.0)
Monocytes Absolute: 0.5 K/uL (ref 0.2–0.9)
Monocytes Relative: 7 %
Neutro Abs: 4.5 K/uL (ref 1.4–6.5)
Neutrophils Relative %: 61 %
Platelets: 275 K/uL (ref 150–440)
RBC: 4.14 MIL/uL (ref 3.80–5.20)
RDW: 13.3 % (ref 11.5–14.5)
WBC: 7.3 K/uL (ref 3.6–11.0)

## 2016-05-17 LAB — COMPREHENSIVE METABOLIC PANEL
ALT: 17 U/L (ref 14–54)
AST: 19 U/L (ref 15–41)
Albumin: 3.9 g/dL (ref 3.5–5.0)
Alkaline Phosphatase: 56 U/L (ref 38–126)
Anion gap: 5 (ref 5–15)
BUN: 20 mg/dL (ref 6–20)
CHLORIDE: 108 mmol/L (ref 101–111)
CO2: 25 mmol/L (ref 22–32)
CREATININE: 0.73 mg/dL (ref 0.44–1.00)
Calcium: 9.2 mg/dL (ref 8.9–10.3)
GFR calc Af Amer: >60 mL/min (ref >60)
Glucose, Bld: 95 mg/dL (ref 65–99)
POTASSIUM: 3.9 mmol/L (ref 3.5–5.1)
SODIUM: 138 mmol/L (ref 135–145)
Total Bilirubin: 0.4 mg/dL (ref 0.3–1.2)
Total Protein: 7.1 g/dL (ref 6.5–8.1)

## 2016-05-17 LAB — POCT PREGNANCY, URINE
Preg Test, Ur: NEGATIVE
Preg Test, Ur: POSITIVE — AB

## 2016-05-17 LAB — HCG, QUANTITATIVE, PREGNANCY: hCG, Beta Chain, Quant, S: 170 m[IU]/mL — ABNORMAL HIGH (ref <5)

## 2016-05-17 LAB — ABO/RH: ABO/RH(D): A POS

## 2016-05-17 MED ORDER — ONDANSETRON 4 MG PO TBDP: 4.0000 mg | ORAL_TABLET | Freq: Three times a day (TID) | ORAL | 0 refills | Status: DC | PRN

## 2016-05-17 NOTE — ED Notes (Signed)
Attempted to hear [facility]HT, unable to locate.

## 2016-05-17 NOTE — ED Triage Notes (Addendum)
Pt tripped and fell Saturday. Pt c/o right ankle pain. Took home pregnancy test and went to health dept and is pregnant. Last period 6 months ago. Reports had negative pregnancy tests until recently per pt. Having some lower abdominal cramping and vaginal bleeding; spotting. Pt landed on side when fell. Has been gaining weight but states " I thought I was getting fat".

## 2016-05-17 NOTE — ED Provider Notes (Signed)
[facility] Emergency Department Provider Note        Time seen: ----------------------------------------- 3:36 PM on 05/17/2016 -----------------------------------------    I have reviewed the triage vital signs and the nursing notes.   HISTORY  Chief Complaint Fall and Vaginal Bleeding    HPI Brandi Solomon is a 25 y.o. female who presents to the ER after she tripped and fell on Saturday. She does have some right ankle pain. She took a home pregnancy test and went to the health department was found to be pregnant. Her last period was normal with 6 months ago. Reportedly she had negative pregnancy test until recently she has been having some abdominal cramping and vaginal spotting.   Past Medical History:  Diagnosis Date  . Asthma     There are no active problems to display for this patient.   Past Surgical History:  Procedure Laterality Date  . CESAREAN SECTION      Allergies Patient has no known allergies.  Social History Social History  Substance Use Topics  . Smoking status: Current Every Day Smoker  . Smokeless tobacco: Not on file  . Alcohol use Yes    Review of Systems Constitutional: Negative for fever. Cardiovascular: Negative for chest pain. Respiratory: Negative for shortness of breath. Gastrointestinal: Positive for abdominal pain Genitourinary: Positive for vaginal spotting Musculoskeletal: Negative for back pain. Skin: Negative for rash. Neurological: Negative for headaches, focal weakness or numbness.  10-point ROS otherwise negative.  ____________________________________________   PHYSICAL EXAM:  VITAL SIGNS: ED Triage Vitals  Enc Vitals Group     BP 05/17/16 1359 (!) 141/85     Pulse Rate 05/17/16 1359 89     Resp 05/17/16 1359 18     Temp 05/17/16 1359 98.6 F (37 C)     Temp Source 05/17/16 1359 Oral     SpO2 05/17/16 1359 100 %     Weight 05/17/16 1400 235 lb (106.6 kg)     Height 05/17/16 1400  5\' 11"  (1.803 m)     Head Circumference --      Peak Flow --      Pain Score 05/17/16 1400 8     Pain Loc --      Pain Edu? --      Excl. in GC? --    Constitutional: Alert and oriented. Well appearing and in no distress. Eyes: Conjunctivae are normal. PERRL. Normal extraocular movements. ENT   Head: Normocephalic and atraumatic.   Nose: No congestion/rhinnorhea.   Mouth/Throat: Mucous membranes are moist.   Neck: No stridor. Cardiovascular: Normal rate, regular rhythm. No murmurs, rubs, or gallops. Respiratory: Normal respiratory effort without tachypnea nor retractions. Breath sounds are clear and equal bilaterally. No wheezes/rales/rhonchi. Gastrointestinal: Soft and nontender. Normal bowel sounds Musculoskeletal: Nontender with normal range of motion in all extremities. No lower extremity tenderness nor edema. Neurologic:  Normal speech and language. No gross focal neurologic deficits are appreciated.  Skin:  Skin is warm, dry and intact. No rash noted. Psychiatric: Mood and affect are normal. Speech and behavior are normal.  ____________________________________________  ED COURSE:  Pertinent labs & imaging results that were available during my care of the patient were reviewed by me and considered in my medical decision making (see chart for details). Clinical Course   Patient is in no distress, we will assess with labs and ultrasound.  Procedures ____________________________________________   LABS (pertinent positives/negatives)  Labs Reviewed  HCG, QUANTITATIVE, PREGNANCY - Abnormal; Notable for the following:  Result Value   hCG, Beta Chain, Quant, S 170 (*)    All other components within normal limits  POCT PREGNANCY, URINE - Abnormal; Notable for the following:    Preg Test, Ur POSITIVE (*)    All other components within normal limits  CBC WITH DIFFERENTIAL/PLATELET  COMPREHENSIVE METABOLIC PANEL  POCT PREGNANCY, URINE  ABO/RH     RADIOLOGY Images were viewed by me  Pregnancy ultrasound IMPRESSION: No intrauterine pregnancy visualized. Differential considerations would include early intrauterine pregnancy too early to visualize, spontaneous abortion, or occult ectopic pregnancy. Recommend close clinical followup and serial quantitative beta HCGs and ultrasounds. ____________________________________________  FINAL ASSESSMENT AND PLAN  Threatened miscarriage  Plan: Patient with labs and imaging as dictated above. Patient is in no distress, likely 2 to [redacted] weeks pregnant. She'll be referred to GYN for close outpatient follow-up and serial hCG.   Emily FilbertWilliams, Jovaughn Wojtaszek E, MD   Note: This dictation was prepared with Dragon dictation. Any transcriptional errors that result from this process are unintentional    Emily FilbertJonathan E Ralphs, MD 05/17/16 1710

## 2016-05-17 NOTE — ED Notes (Addendum)
Disregard 14:08 neg test results.

## 2016-05-20 ENCOUNTER — Emergency Department
Admission: EM | Admit: 2016-05-20 | Discharge: 2016-05-20 | Disposition: A | Discharge status: Home / Self Care | Payer: Medicaid Other | Attending: Emergency Medicine | Admitting: Emergency Medicine

## 2016-05-20 ENCOUNTER — Encounter: Payer: Self-pay | Admitting: Emergency Medicine

## 2016-05-20 DIAGNOSIS — F17200 Nicotine dependence, unspecified, uncomplicated: Secondary | ICD-10-CM | POA: Insufficient documentation

## 2016-05-20 DIAGNOSIS — J45909 Unspecified asthma, uncomplicated: Secondary | ICD-10-CM | POA: Diagnosis not present

## 2016-05-20 DIAGNOSIS — O039 Complete or unspecified spontaneous abortion without complication: Secondary | ICD-10-CM | POA: Diagnosis not present

## 2016-05-20 DIAGNOSIS — N939 Abnormal uterine and vaginal bleeding, unspecified: Secondary | ICD-10-CM | POA: Diagnosis present

## 2016-05-20 LAB — HCG, QUANTITATIVE, PREGNANCY: HCG, BETA CHAIN, QUANT, S: 106 m[IU]/mL — AB (ref <5)

## 2016-05-20 MED ORDER — IBUPROFEN 600 MG PO TABS: 600.0000 mg | ORAL_TABLET | Freq: Three times a day (TID) | ORAL | 0 refills | Status: DC | PRN

## 2016-05-20 MED ORDER — HYDROCODONE-ACETAMINOPHEN 5-325 MG PO TABS: 1.0000 | ORAL_TABLET | Freq: Four times a day (QID) | ORAL | 0 refills | Status: DC | PRN

## 2016-05-20 NOTE — Discharge Instructions (Signed)
From Dr. Shaune PollackLord:  You were evaluated after cramping and bleeding and as we discussed, your beta hcg (blood pregnancy test) is going down consistent with a miscarriage.  Please follow up with an OB/GYN doctor in about 1 week.  Return to the emergency department for any worsening condition including heavy bleeding, such as changing 1 pad per hour for several hours, any dizziness, lightheadedness, passing out, or any other symptoms concerning to you.

## 2016-05-20 NOTE — ED Triage Notes (Signed)
Pt states was seen here 3 days ago and was dx with being 3.[redacted] weeks pregnant. Pt states started having vaginal bleeding x1 week, states worsening since was seen 3 days ago. Pt also reports abdominal cramping at this time. Pt is a G2L1. Pt is alert and oriented at this time. Pt states since yesterday she soaked through 3 pads, states some clots noted on her pad this morning.

## 2016-05-20 NOTE — ED Provider Notes (Signed)
[facility] Emergency Department Provider Note ____________________________________________   I have reviewed the triage vital signs and the triage nursing note.  HISTORY  Chief Complaint Vaginal Bleeding   Historian Patient, significant other and mother at the bedside too  HPI Brandi Solomon is a 25 y.o. female G2 P1, presents today after being seen for spotting on 05/17/16 here in the emergency department and had a ultrasoundshowing no IUP, including no gestational sac, yolk sac, or embryo, and was discharged with early pregnancy versus failed pregnancy/miscarriage.  Yesterday and today she has had moderate lower uterine cramping and heavier bleeding including some clots this morning. No dizziness or lightheadedness. No trouble breathing. No chest pain.      Past Medical History:  Diagnosis Date  . Asthma     There are no active problems to display for this patient.   Past Surgical History:  Procedure Laterality Date  . CESAREAN SECTION      Prior to Admission medications   Medication Sig Start Date End Date Taking? Authorizing Provider  albuterol (PROVENTIL HFA;VENTOLIN HFA) 108 (90 BASE) MCG/ACT inhaler Inhale 2 puffs into the lungs every 6 (six) hours as needed for wheezing or shortness of breath. 01/11/15   Phineas SemenGraydon Goodman, MD  albuterol (PROVENTIL HFA;VENTOLIN HFA) 108 (90 Base) MCG/ACT inhaler Inhale 2 puffs into the lungs every 4 (four) hours as needed for wheezing or shortness of breath. 07/19/15   Shawn C Joy, PA-C  albuterol (PROVENTIL) (2.5 MG/3ML) 0.083% nebulizer solution Take 3 mLs (2.5 mg total) by nebulization every 6 (six) hours as needed for wheezing or shortness of breath. 01/11/15   Phineas SemenGraydon Goodman, MD  erythromycin ophthalmic ointment Place a 1/2 inch ribbon of ointment into the lower eyelid. Apply 4 times a day for the next 3 days. 07/19/15   Shawn C Joy, PA-C  HYDROcodone-acetaminophen (NORCO/VICODIN) 5-325 MG tablet Take 1 tablet  by mouth every 6 (six) hours as needed for moderate pain. 05/20/16   Governor Rooksebecca Deklin Bieler, MD  ibuprofen (ADVIL,MOTRIN) 600 MG tablet Take 1 tablet (600 mg total) by mouth every 8 (eight) hours as needed. 05/20/16   Governor Rooksebecca Pansy Ostrovsky, MD  ondansetron (ZOFRAN ODT) 4 MG disintegrating tablet Take 1 tablet (4 mg total) by mouth every 8 (eight) hours as needed for nausea or vomiting. 05/17/16   Emily FilbertJonathan E Riggin, MD    No Known Allergies  History reviewed. No pertinent family history.  Social History Social History  Substance Use Topics  . Smoking status: Current Every Day Smoker  . Smokeless tobacco: Not on file     Comment: Quit since finding out she was pregnant.   . Alcohol use Yes     Comment: Quit since finding out she was pregnant.     Review of Systems  Constitutional: Negative for fever. Eyes:  ENT:  Cardiovascular: Negative for chest pain. Respiratory: Negative for shortness of breath. Gastrointestinal: Negative for abdominal pain, vomiting and diarrhea. Genitourinary: Musculoskeletal: Negative for back pain. Skin: Negative for rash. Neurological: Negative for headache.  ____________________________________________   PHYSICAL EXAM:  VITAL SIGNS: ED Triage Vitals  Enc Vitals Group     BP 05/20/16 1018 109/72     Pulse Rate 05/20/16 1018 77     Resp 05/20/16 1018 18     Temp 05/20/16 1018 98 F (36.7 C)     Temp Source 05/20/16 1018 Oral     SpO2 05/20/16 1018 100 %     Weight 05/20/16 1016 230 lb (104.3 kg)  Height 05/20/16 1016 5\' 11"  (1.803 m)     Head Circumference --      Peak Flow --      Pain Score 05/20/16 1016 9     Pain Loc --      Pain Edu? --      Excl. in GC? --      Constitutional: Alert and oriented. Well appearing and in no distress. HEENT   Head: Normocephalic and atraumatic.      Eyes: Conjunctivae are normal. PERRL. Normal extraocular movements.      Ears:         Nose: No congestion/rhinnorhea.   Mouth/Throat: Mucous membranes are  moist.   Neck: No stridor. Cardiovascular/Chest: Normal rate, regular rhythm.  No murmurs, rubs, or gallops. Respiratory: Normal respiratory effort without tachypnea nor retractions. Breath sounds are clear and equal bilaterally. No wheezes/rales/rhonchi. Gastrointestinal: Soft. No distention, no guarding, no rebound. Nontender.  Obese.  Genitourinary/rectal:Deferred Musculoskeletal: Nontender with normal range of motion in all extremities.  Neurologic:  Normal speech and language. No gross or focal neurologic deficits are appreciated. Skin:  Skin is warm, dry and intact. No rash noted. Psychiatric: Mood and affect are normal. Speech and behavior are normal. Patient exhibits appropriate insight and judgment.   ____________________________________________  LABS (pertinent positives/negatives)  Labs Reviewed  HCG, QUANTITATIVE, PREGNANCY - Abnormal; Notable for the following:       Result Value   hCG, Beta Chain, Quant, S 106 (*)    All other components within normal limits    ____________________________________________    EKG I, Governor Rooksebecca Thekla Colborn, MD, the attending physician have personally viewed and interpreted all ECGs.  None ____________________________________________  RADIOLOGY All Xrays were viewed by me. Imaging interpreted by Radiologist.  None __________________________________________  PROCEDURES  Procedure(s) performed: None  Critical Care performed: None  ____________________________________________   ED COURSE / ASSESSMENT AND PLAN  Pertinent labs & imaging results that were available during my care of the patient were reviewed by me and considered in my medical decision making (see chart for details).   We discussed her symptoms, and decreasing beta hCG are consistent with failed pregnancy, and now with the bleeding and cramping consistent with miscarriage.  She was going [place], but we'll provide also on call Dr.  Edison PaceJackson's office number for follow-up.  We discussed proceeding precautions.  I reviewed her blood type A+ in the computer and with the patient herself.  Stable vital signs. Nontender abdomen. We discussed whether or not to do a pelvic exam, and at this point it sounds like things are stable, and consistent with miscarriage, and I'm not sure if pelvic exam would add any additional change in management, and I discussed this with the patient, and she stated that she preferred not to do a pelvic exam at this point time.  I think this is completely reasonable.  CONSULTATIONS:   None   Patient / Family / Caregiver informed of clinical course, medical decision-making process, and agree with plan.   I discussed return precautions, follow-up instructions, and discharge instructions with patient and/or family.   ___________________________________________   FINAL CLINICAL IMPRESSION(S) / ED DIAGNOSES   Final diagnoses:  Miscarriage              Note: This dictation was prepared with Dragon dictation. Any transcriptional errors that result from this process are unintentional    Governor Rooksebecca Calton Harshfield, MD 05/20/16 1139

## 2016-05-20 NOTE — ED Notes (Signed)
ED Provider at bedside. 

## 2016-08-03 ENCOUNTER — Encounter: Payer: Self-pay | Admitting: Emergency Medicine

## 2016-08-03 ENCOUNTER — Emergency Department
Admission: EM | Admit: 2016-08-03 | Discharge: 2016-08-03 | Disposition: A | Discharge status: Home / Self Care | Payer: Medicaid Other | Attending: Emergency Medicine | Admitting: Emergency Medicine

## 2016-08-03 DIAGNOSIS — J4541 Moderate persistent asthma with (acute) exacerbation: Secondary | ICD-10-CM

## 2016-08-03 DIAGNOSIS — F17210 Nicotine dependence, cigarettes, uncomplicated: Secondary | ICD-10-CM | POA: Insufficient documentation

## 2016-08-03 DIAGNOSIS — Z716 Tobacco abuse counseling: Secondary | ICD-10-CM

## 2016-08-03 DIAGNOSIS — Z79899 Other long term (current) drug therapy: Secondary | ICD-10-CM | POA: Diagnosis not present

## 2016-08-03 DIAGNOSIS — R05 Cough: Secondary | ICD-10-CM | POA: Diagnosis present

## 2016-08-03 MED ORDER — ALBUTEROL SULFATE (2.5 MG/3ML) 0.083% IN NEBU
INHALATION_SOLUTION | RESPIRATORY_TRACT | Status: AC
  Filled 2016-08-03: qty 6

## 2016-08-03 MED ORDER — PREDNISONE 20 MG PO TABS: 60.0000 mg | ORAL_TABLET | Freq: Every day | ORAL | 0 refills | Status: DC

## 2016-08-03 MED ORDER — ALBUTEROL SULFATE (2.5 MG/3ML) 0.083% IN NEBU: 2.5000 mg | INHALATION_SOLUTION | Freq: Four times a day (QID) | RESPIRATORY_TRACT | 0 refills | Status: DC | PRN

## 2016-08-03 MED ORDER — ALBUTEROL SULFATE (2.5 MG/3ML) 0.083% IN NEBU
5.0000 mg | INHALATION_SOLUTION | Freq: Once | RESPIRATORY_TRACT | Status: AC
  Administered 2016-08-03: 5 mg via RESPIRATORY_TRACT

## 2016-08-03 MED ORDER — ALBUTEROL SULFATE HFA 108 (90 BASE) MCG/ACT IN AERS: 2.0000 | INHALATION_SPRAY | RESPIRATORY_TRACT | 0 refills | Status: DC | PRN

## 2016-08-03 MED ORDER — IPRATROPIUM-ALBUTEROL 0.5-2.5 (3) MG/3ML IN SOLN
RESPIRATORY_TRACT | Status: AC
  Filled 2016-08-03: qty 6

## 2016-08-03 MED ORDER — IPRATROPIUM-ALBUTEROL 0.5-2.5 (3) MG/3ML IN SOLN
RESPIRATORY_TRACT | Status: AC
  Administered 2016-08-03: 6 mL via RESPIRATORY_TRACT
  Filled 2016-08-03: qty 6

## 2016-08-03 MED ORDER — IPRATROPIUM-ALBUTEROL 0.5-2.5 (3) MG/3ML IN SOLN
6.0000 mL | Freq: Once | RESPIRATORY_TRACT | Status: AC
  Administered 2016-08-03: 6 mL via RESPIRATORY_TRACT

## 2016-08-03 NOTE — ED Notes (Signed)
Pt reports she has asthma and that today she is short of breath - pt states she has a dry cough - pt has been sick for the last 24 hours - Dr Sharma CovertNorman at bedside assessing pt

## 2016-08-03 NOTE — ED Notes (Signed)
Pt with hx of asthma co shob

## 2016-08-03 NOTE — ED Notes (Signed)
Called respiratory to come and give pt a 30 minute cont. neb

## 2016-08-03 NOTE — Discharge Instructions (Signed)
Please stop smoking. Do not expose yourself to known triggers, including pet hair and cigarette smoke.  Please talk to your primary care doctor about whether you should be started on daily preventative asthma medications.  Return to the emergency department for shortness of breath, pain, fever, or any other symptoms concerning to you.

## 2016-08-03 NOTE — ED Provider Notes (Signed)
[facility] Emergency Department Provider Note  ____________________________________________  Time seen: Approximately 8:40 PM  I have reviewed the triage vital signs and the nursing notes.   HISTORY  Chief Complaint Asthma    HPI Brandi Solomon is a 26 y.o. female with a history of asthma presenting with asthma exacerbation. The patient reports that cigarette smoke and pet hair trigger her asthma. She is a smoker and sleeps with her dogs. Over the past 2 days, the patient has had progressively worsening wheezing with an intermittent mild nonproductive cough. She is not had fever, chills, congestion or rhinorrhea, sore throat or ear pain. No chest pain. She lost her albuterol inhaler and has been unable to control her symptoms. She was given 60 mg prednisone by her mother point minutes prior to arrival.  Patient reports 4-5 ED visits for asthma in the past year; she has never been hospitalized or intubated for asthma.   Past Medical History:  Diagnosis Date  . Asthma     There are no active problems to display for this patient.   Past Surgical History:  Procedure Laterality Date  . CESAREAN SECTION      Current Outpatient Rx  . Order #: [id number] Class: Print  . Order #: [id number] Class: Print  . Order #: [id number] Class: Print  . Order #: [id number] Class: Print  . Order #: [id number] Class: Print  . Order #: [id number] Class: Print    Allergies Patient has no known allergies.  No family history on file.  Social History Social History  Substance Use Topics  . Smoking status: Current Every Day Smoker  . Smokeless tobacco: Never Used     Comment: Quit since finding out she was pregnant.   . Alcohol use Yes     Comment: Quit since finding out she was pregnant.     Review of Systems Constitutional: No fever/chills.No lightheadedness or syncope. Eyes: No visual changes. ENT: No sore throat. No congestion or rhinorrhea. No ear  pain. Cardiovascular: Denies chest pain. Denies palpitations. Respiratory: Positive shortness of breath.  Positive mild intermittent nonproductive cough. Positive wheezing. Gastrointestinal: No abdominal pain.  No nausea, no vomiting.  No diarrhea.  No constipation. Genitourinary: Negative for dysuria. Musculoskeletal: Negative for back pain. Skin: Negative for rash. Neurological: Negative for headaches. No focal numbness, tingling or weakness.   10-point ROS otherwise negative.  ____________________________________________   PHYSICAL EXAM:  VITAL SIGNS: ED Triage Vitals  Enc Vitals Group     BP 08/03/16 2025 132/83     Pulse Rate 08/03/16 2025 88     Resp 08/03/16 2025 (!) 24     Temp 08/03/16 2025 99 F (37.2 C)     Temp Source 08/03/16 2025 Oral     SpO2 08/03/16 2025 99 %     Weight 08/03/16 2023 170 lb (77.1 kg)     Height 08/03/16 2023 5\' 11"  (1.803 m)     Head Circumference --      Peak Flow --      Pain Score --      Pain Loc --      Pain Edu? --      Excl. in GC? --     Constitutional: The patient is alert, oriented and answering questions appropriately. She is tachypneic but mentating well. Eyes: Conjunctivae are normal.  EOMI. No scleral icterus. No eye discharge. Head: Atraumatic. Nose: No congestion/rhinnorhea. Mouth/Throat: Mucous membranes are moist.  Neck: No stridor.  Supple.  No JVD. No meningismus. Cardiovascular: Normal rate, regular rhythm.  No murmurs, rubs or gallops.  Respiratory: The patient is tachypnea with accessory muscle use but no retractions. She is able to speak 3-4 word sentences. Diffuse wheezing in the lung bases bilaterally without rales or rhonchi. O2 sats are 99% on room air on my examination Gastrointestinal: Obese. Soft, nontender and nondistended.  No guarding or rebound.  No peritoneal signs. Musculoskeletal: No LE edema. No ttp in the calves or palpable cords.  Negative Homan's sign. Neurologic:  A&Ox3.  Speech is clear.  Face  and smile are symmetric.  EOMI.  Moves all extremities well. Skin:  Skin is warm, dry and intact. No rash noted. Psychiatric: Mood and affect are normal. Speech and behavior are normal.  Normal judgement.  ____________________________________________   LABS (all labs ordered are listed, but only abnormal results are displayed)  Labs Reviewed - No data to display ____________________________________________  EKG  Not indicated ____________________________________________  RADIOLOGY  No results found.  ____________________________________________   PROCEDURES  Procedure(s) performed: None  Procedures  Critical Care performed: No ____________________________________________   INITIAL IMPRESSION / ASSESSMENT AND PLAN / ED COURSE  Pertinent labs & imaging results that were available during my care of the patient were reviewed by me and considered in my medical decision making (see chart for details).  26 y.o. female with a history of asthma presenting with asthma exacerbation. The patient has multiple triggers to which she is exposed daily. At this time, there are no physical exam findings or history suggestive of acute pulmonary infection, respiratory compromise requiring admission, or cardiac causes for her wheezing and shortness of breath. We will plan a DuoNeb, and reevaluate the patient for final disposition. I have had a long discussion with the patient about smoking cessation and removal of her dogs as they are trigger for her asthma.  ----------------------------------------- 9:06 PM on 08/03/2016 -----------------------------------------  The patient is breathing much more comfortably and her wheezing has significantly improved at this time. She continues to have some end expiratory wheezing, so we'll give her continuous neb over 30 minutes and reevaluate her for final disposition.  ----------------------------------------- 9:54 PM on  08/03/2016 -----------------------------------------  The patient's wheezing has completely resolved at this time. We will plan discharge with close PMD follow-up. Return precautions as well as follow-up instructions were discussed. ____________________________________________  FINAL CLINICAL IMPRESSION(S) / ED DIAGNOSES  Final diagnoses:  Moderate persistent asthma with acute exacerbation  Tobacco abuse counseling         NEW MEDICATIONS STARTED DURING THIS VISIT:  New Prescriptions   ALBUTEROL (PROVENTIL HFA;VENTOLIN HFA) 108 (90 BASE) MCG/ACT INHALER    Inhale 2 puffs into the lungs every 4 (four) hours as needed for wheezing or shortness of breath.   PREDNISONE (DELTASONE) 20 MG TABLET    Take 3 tablets (60 mg total) by mouth daily.      Rockne MenghiniAnne-Caroline Semisi Biela, MD 08/03/16 2154

## 2016-08-03 NOTE — ED Triage Notes (Addendum)
Pt to triage via w/c, reports hx of asthma and "can't find inhaler" (albuterol) denies any recent illness; recent wheezing and nonprod cough; +smoker

## 2018-03-07 ENCOUNTER — Ambulatory Visit: Payer: Self-pay | Admitting: Maternal Newborn

## 2018-03-13 ENCOUNTER — Ambulatory Visit (INDEPENDENT_AMBULATORY_CARE_PROVIDER_SITE_OTHER): Payer: Medicaid Other | Admitting: Obstetrics and Gynecology

## 2018-03-13 ENCOUNTER — Encounter: Payer: Self-pay | Admitting: Obstetrics and Gynecology

## 2018-03-13 VITALS — BP 124/74 | Ht 71.0 in | Wt 223.0 lb

## 2018-03-13 DIAGNOSIS — Z3009 Encounter for other general counseling and advice on contraception: Secondary | ICD-10-CM

## 2018-03-13 DIAGNOSIS — O039 Complete or unspecified spontaneous abortion without complication: Secondary | ICD-10-CM

## 2018-03-13 LAB — POCT URINE PREGNANCY: Preg Test, Ur: NEGATIVE

## 2018-03-13 MED ORDER — DESOGESTREL-ETHINYL ESTRADIOL 0.15-30 MG-MCG PO TABS: 1.0000 | ORAL_TABLET | Freq: Every day | ORAL | 11 refills | Status: DC

## 2018-03-13 NOTE — Progress Notes (Signed)
Patient ID: [name], female   DOB: [birth year], 27 y.o.   MRN: [id number]  Reason for Consult: Follow-up (follow up from [facility] for miscarraige)   Referred by No ref. provider found  Subjective:     HPI:  Brandi Solomon is a 27 y.o. female . She presents today to follow up from a first trimester miscarriage. She is no longer having bleeding or pain.   Past Medical History:  Diagnosis Date  . Asthma    Family History  Problem Relation Age of Onset  . Throat cancer Maternal Aunt   . Skin cancer Maternal Aunt   . Hypertension Maternal Aunt   . Diabetes Mellitus II Maternal Aunt    Past Surgical History:  Procedure Laterality Date  . CESAREAN SECTION      Short Social History:  Social History   Tobacco Use  . Smoking status: Current Every Day Smoker  . Smokeless tobacco: Never Used  . Tobacco comment: Quit since finding out she was pregnant.   Substance Use Topics  . Alcohol use: Yes    Comment: Quit since finding out she was pregnant.     No Known Allergies  Current Outpatient Medications  Medication Sig Dispense Refill  . albuterol (PROVENTIL HFA;VENTOLIN HFA) 108 (90 Base) MCG/ACT inhaler Inhale 2 puffs into the lungs every 4 (four) hours as needed for wheezing or shortness of breath. (Patient not taking: Reported on 03/13/2018) 1 Inhaler 0  . albuterol (PROVENTIL) (2.5 MG/3ML) 0.083% nebulizer solution Take 3 mLs (2.5 mg total) by nebulization every 6 (six) hours as needed for wheezing or shortness of breath. (Patient not taking: Reported on 03/13/2018) 75 mL 0  . desogestrel-ethinyl estradiol (APRI) 0.15-30 MG-MCG tablet Take 1 tablet by mouth daily. 1 Package 11  . erythromycin ophthalmic ointment Place a 1/2 inch ribbon of ointment into the lower eyelid. Apply 4 times a day for the next 3 days. (Patient not taking: Reported on 03/13/2018) 1 g 0  . HYDROcodone-acetaminophen (NORCO/VICODIN) 5-325 MG tablet Take 1 tablet by mouth every 6 (six) hours as needed  for moderate pain. (Patient not taking: Reported on 03/13/2018) 6 tablet 0  . ibuprofen (ADVIL,MOTRIN) 600 MG tablet Take 1 tablet (600 mg total) by mouth every 8 (eight) hours as needed. (Patient not taking: Reported on 03/13/2018) 20 tablet 0  . ondansetron (ZOFRAN ODT) 4 MG disintegrating tablet Take 1 tablet (4 mg total) by mouth every 8 (eight) hours as needed for nausea or vomiting. (Patient not taking: Reported on 03/13/2018) 20 tablet 0  . predniSONE (DELTASONE) 20 MG tablet Take 3 tablets (60 mg total) by mouth daily. (Patient not taking: Reported on 03/13/2018) 15 tablet 0   No current facility-administered medications for this visit.     Review of Systems  Constitutional: Negative for chills, fatigue, fever and unexpected weight change.  HENT: Negative for trouble swallowing.  Eyes: Negative for loss of vision.  Respiratory: Negative for cough, shortness of breath and wheezing.  Cardiovascular: Negative for chest pain, leg swelling, palpitations and syncope.  GI: Negative for abdominal pain, blood in stool, diarrhea, nausea and vomiting.  GU: Negative for difficulty urinating, dysuria, frequency and hematuria.  Musculoskeletal: Negative for back pain, leg pain and joint pain.  Skin: Negative for rash.  Neurological: Negative for dizziness, headaches, light-headedness, numbness and seizures.  Psychiatric: Negative for behavioral problem, confusion, depressed mood and sleep disturbance.        Objective:  Objective   Vitals:   03/13/18 1001  BP: 124/74  Weight: 223 lb (101.2 kg)  Height: 5\' 11"  (1.803 m)   Body mass index is 31.1 kg/m.  Physical Exam  Constitutional: She is oriented to person, place, and time. She appears well-developed and well-nourished.  HENT:  Head: Normocephalic and atraumatic.  Eyes: Pupils are equal, round, and reactive to light. EOM are normal.  Cardiovascular: Normal rate and regular rhythm.  Pulmonary/Chest: Effort normal. No respiratory  distress.  Neurological: She is alert and oriented to person, place, and time.  Skin: Skin is warm and dry.  Psychiatric: She has a normal mood and affect. Her behavior is normal. Judgment and thought content normal.  Nursing note and vitals reviewed.      Assessment/Plan:    27 yo with a first trimester miscarriage.  She will follow up in 1-2 weeks for an [place] to ensure that she has passed all tissue related to the pregnancy.  She would like to start an OCP today. Given resources for grief  Adelene Idler MD [facility], [facility] 03/13/18 10:40 AM

## 2018-03-13 NOTE — Patient Instructions (Signed)
Please accept our heartfelt condolences and call us if you need an ear to listen.  Grief counseling is available through [facility]. Please contact MJ Tucci at [phone]   Resources: [email or web address] This site has an excellent pamphlet that you can download on early pregnancy loss.  Miscarriage: Women Sharing from the Heart by Lorre Munroe and Ellyn Hack. Published by [employer]. Summarizes reactions of 100 women who experience miscarriage. Molly's [place] by Baldemar Lenis. Published by Dar[employer]. Children's book on miscarriage. Miscarriage: A Man's Book by Philippa Sicks. Published by [employer] at [phone]. Written to help men understand reactions of both parents to miscarriage. Miscarriage: A Shattered Dream by Peri Maris and Lubertha Sayres. I Never Held You: A book about miscarriage, healing and recovery by Armen Pickup. [place].   MISCARRIAGE OR NEONATAL LOSS.a word to parents By Carmie End, MSW For those who experience a miscarriage or fetal loss, the world has turned upside down. Regardless of whether the pregnancy was planned and wanted, or unplanned and the parents were ambivalent, there are feelings of sadness, grief and a loss of equilibrium which effects both parents. Though we allow for these feelings when other deaths occur, we are remiss in acknowledging and allowing parents to grieve their pregnancy loss or miscarriage. We wrongly equate the depth of the loss to the size of the casket. With pregnancy loss or death before birth, there is little guidance or expectation of what is normal grief. Others, who minimize or misunderstand the loss, may expect that you "get back to normal" quickly. With pressures to overlook the impact of the loss, healthy grieving processes can get stunted, overlooked, or stopped all together. I hope that this  handout will allow you to acknowledge and express whatever loss you feel, to share with others your sorrow and will encourage you and others to take a moment to understand and experience the depth of your emotions at this time.  A baby represents so much. It may represent hope, immortality, fulfillment of dreams, or an outward sign of a loving relationship. At the same time, pregnancy may bring on anxiety, fear, and an increase in commitment and responsibility. Understandably, few pregnant parents experience only one or the other of these emotions. Despite the type of emotional response to the pregnancy, as the process continues, what is central to almost all pregnancies is that over time, the bond between parent and child grows. Each day brings an expansion of the world. What was once routine now becomes more important. Diet, lifestyle, connection to family, time commitments, relationships with friends, spouse, others.nothing is as it was. These are the normal changes that a pregnant woman and her partner experience. When a baby dies, nothing that made sense before makes any sense after. We are left emotionally raw. Often our bodies respond in ways that assure [place] that we are crazy, but we are not. We are grieving a life that we never fully knew, a relationship that was too short. A part of [place] has died too. Mothers and fathers become more attached to a pregnancy as each day, week and month go by. Mothers have physical contact with the pregnancy, daily reminders of the growth and change occurring. They connect by talking to their bellies, by wondering what life will be like this time next year, look for clothes, cribs, pediatricians and more. Fathers often become attached in different ways: considering buying a new car or safe car seat, working more to  earn a little more money before the baby arrives, or by reconsidering the family's financial status. However bonding occurs, it  generally increases as the baby grows. For family members, co-workers and friends, the pregnancy may be less real. They may only know of the pregnancy from a physical or emotional distance. Since many of [place] are so uncomfortable with death and we work hard to deny it. With a prenatal loss, the lack of contact with the unborn baby may encourage others to minimize the loss, or even suggest that it was something to feel thankful for at this time suggesting that the pain felt now would be significantly less than at any other time in relationship to this child. That is why it feels so crazy to be in such pain when others might not understand. You might feel that you are still standing at an emotional graveside and the world wonders "when you'll be ready to go back to work" or "why you still cry. It's been four months already." Be assured that it is the rest of the world that is, in this case, acting crazy. When a living child or adult dies, we have understandings, behaviors, rituals that though painful, enable [place] to feel cared for, comforted, acknowledged in our loss, and encourage healing. Unfortunately, too little of this is the case when there is a miscarriage or neonatal loss. There are many difficult dilemmas that you might face in the wake of the loss. Do you have a funeral? How do you tell people? Do you publish an obituary? What do you do with the remains, both physical and emotional? I encourage people to find a formalized way to acknowledge the loss of the pregnancy and to honor the relationship that did and does still exist and will for the rest of your life. It is your choice as to whether this happens in a funeral service, in a [employer], in a letter to the child who you did not meet or in some manner that makes sense to you religiously, spiritually or emotionally. It is not silly to ask friends or family to participate nor is it improper to keep this intensely personal and  private. Grief, however, needs to be a public process. We must grieve among others and with our loved ones. We do not all need to feel the same amount of pain in the loss, but we need to act as a loving community to support those most hurt at this time. Healthy grieving involves a strengthening of the bonds with others who we love and who love [place]. Some people ask a close friend or relative to contact others and inform them of the loss to minimize the telling of the story. Others find relief in telling friends, family and colleagues about the loss, in that it reminds [place] that the pregnancy was real and that this pain is real. Where one connection is broken, another is reinforced and strengthened. Grieving in isolation can be detrimental. By inviting important others to a funeral, tree planting, or sunset service and by taking them up on an offer to help will help your healing. Some people have described aspects of grief after a pregnancy loss that are quite different from those present after the loss of an adult relative or friend. Some things that might seem "weird" or "abnormal" are indeed, quite normal following this kind of loss. Alberteen Spindle described her experience in the book "Understanding: Death of the Wished-for Child." "I kept searching for something. I wasn't  sure what it was. All of a sudden one day in the kitchen, I spontaneously got out my kitchen scales and started weighing fruits and vegetables. I realized I was trying to find something that had the identical weight that the baby did.I found myself weighing my rolling pin.it happened to be the identical length and weight that the baby was." This describes the process of building memories. With miscarriage and neonatal loss, there are too few memories of the pregnancy or the baby itself. Parents may not ever be able to see or hold a baby in these cases, especially in the earlier trimesters, so saving mementos and gathering and  providing information are especially important. Alberteen Spindle described a parent who needs to see and feel something that had similar dimensions as the child. This is profoundly different than the grieving that happens when a 27 year old person dies, when we have years of memories, concrete possessions, photos and history. Though it is a different aspect of grief, it is normal, expected and healthy. Your grief will not make you crazy. Also, do not be frightened if you feel this loss in a very physical manner. Your arms or chest may ache, convincing you that your heart has broken. Women have described this as a feeling of "empty arms". They also describe a similar feeling of an emptiness in their bellies, which was previously full. Some women also swear that they still feel the baby moving in their bellies. Many people report being woken to the sound of a crying baby, having vivid dreams about babies, trying to find their lost babies, or graphic dreams of injuries happening to themselves or others. Breasts may still feel as if they are filling and it may seem as if the body is unaware of the loss. These things can feel quite confusing. If you experience any of these responses, please tell someone about them. They are not a sign of insanity, they are aspects of grief. Keeping them to yourself increases the feelings of isolation and disequilibrium. Be reassured that these are normal experiences after the loss of a pregnancy. A WORD ABOUT DIFFERENCES BETWEEN MEN'S AND WOMEN'S GRIEF If most people do not fully understand the pain of a mother's grief through pregnancy loss, there is an even greater denial on society's part that the father is in pain. I have seen men in great pain and despair after the loss of a pregnancy and they say that nobody has ever asked them about their loss. People may only ask about their wife/partner's pain. To put it simply, one father, head bowed, shoulders heaving as  he cried said "if one more person asks me how Erskine Squibb is, I'll fly apart. Don't they know I lost a child too? Don't they see the circles under my eyes, my face, my pain? Don't I count?" Couples often move through the grief process in different ways and at different times. Do not assume that your partner is better because they are wanting to take in a movie or go out to eat. This may just be a diversion, a need for a change of scenery, and a yearning for normalcy. Women are usually more comfortable crying and men find safety in action. For this reason, friction can develop between partners, when one wants the other to start behaving like they did before the loss or to cry along with them. Respect one another's process. Talk to your partner about the specifics. If asked, "How are you today?" many people will  just say "fine". Specifically relate that you have had a hard time with the holiday, or seeing a friend's new baby, and ask your partner how they felt in that moment. Sex is very often a very difficult prospect after a miscarriage or loss. It might remind a person of the conception, raise fears of another pregnancy and future children, or seem like too much pleasure to experience when one is otherwise in pain. Many men reconnect with their partner by being sexual and feel increasingly isolated when a partner does not respond. Talk. There may be a comfortable middle ground with each other. If the general patterns of the relationship shift toward uncomfortable dynamics, such as decreased communication, blaming one another, or increased absences from the home, it is important to seek counseling with a professional. This is not an easy time, you have had a loss. Be patient with yourself. Seek help and support from loved ones, friends, professionals. The grief will evolve with time and you will not always be in such immediate or raw pain. Be assured that you may never forget your pregnancy or your  baby. Healing takes time. I have compiled a list of books on grief and death that might be useful. *Anna: A Daughter's Life. Trinna Post, [employer], 1610. A father's account of his grief at the loss of his infant daughter. *Explaining Death to Children. Freddi Che, [address], 9604. A valuable guide to help adults/parents explain death to children. *When Pregnancy Fails: Families Coping with Miscarriage, Ectopic Pregnancy, Stillbirth, and Infant Death. Jodie Echevaria and Marijean Heath, [employer], 5409. A good resource to understand pregnancy loss, including some information on support for the family. *Hour of Gold, Hour of Lead: Diaries and Letters of Gaspar Cola. [address] [place], 8119. Cheri Fowler writes of the pain and loss in the wake of their young child's abduction. *A Child Dies: A Portrait of Family Grief. Clydene Laming and Penelope [address] [place], the [place], 1478. Dealing with death of unborn children, infants, and children of all ages. Revised by Davonna Belling. Lennox Pippins, MS November, 2008  COPING WITH A MISCARRIAGE Newman Nip, R.N., M.A.T. You have had a miscarriage. You have lost a pregnancy. This is undoubtedly a difficult and sad time for you. Your loss may be hard to believe and it is frustrating to know you are helpless to change anything. "This can't possibly be happening to me. Why me? Why not someone else" I promise I'll be more careful if I can only have my baby back." These are common reactions to experiencing a miscarriage. Feelings of disbelief and anger, helplessness and guilt, depression and perhaps failure, are real and understandable. You may tell yourself that the guilt you feel is irrational, unreasonable or inappropriate, but it is there. "What did I do to cause this? Why wasn't I more careful? If only I had known. Is this a punishment for something I did?" You may find yourself recounting the days or  weeks before your miscarriage, searching for clues that you feel sure you must have missed; searching for valid reasons for how or why this happened. Having something "taken away" that you cherish feels shocking and unbelievable. You ask your doctor or midwife for an explanation; friends volunteer their opinions. In many cases, your questions of "how" or "why" are not satisfactorily answered. You may have some of the above thoughts or feelings and they may be present in different combinations, differing degrees, and in some confusion. They are understandable  and part of the process of beginning to cope with a significant loss. With any death, it is healthy and essential to allow yourself to experience grief whether you are a man or woman. The grieving process is not something begun and completed in a day, a week or a month. It takes time, understanding, support and acceptance. In the case of a miscarriage, this process is often more difficult because the death is not publicly acknowledged. There is no funeral to arrange and attend, no outward recognition of a loss. This may tend to increase feelings of isolation and loneliness. The term miscarriage is used here instead of the more medically correct term spontaneous abortion to avoid confusion with elective abortion. Some couples feel they must keep their grief invisible and "get over this quickly." Most employers will freely give time off to attend a family funeral, but many do not understand the reasonable and justified request for time off after a miscarriage. Loss of a longed-for pregnancy, regardless of how early, is a significant bereavement. Men and women may react to the miscarriage differently. Men may feel more helpless and frustrated than their wives because they often assume a passive-observer role. Waiting and watching can be harder than actively participating, regardless of the outcome. Men do experience a miscarriage in spite of  not actively participating in the physical loss. People have different ways of coping with a loss and you need to choose what is most helpful to you. Too often "being strong" is looked upon as a healthy way of coping, when, in fact, repressing and ignoring very real feelings is not helpful and contributes to serious coping problems. Some feelings and questions that men and women express and ask are:  It hangs over me like a black cloud. Though I've tried, I honestly don't know how to work this through. How do I say goodbye?  I feel extremely guilty. I guess I really didn't want this pregnancy. I feel relieved that it's over, but so guilty because I'm relieved. This isn't right.  I'm fine. Life must go on and I'm a strong person. There's really nothing to grieve for anyway, is there?  I feel pressure not to be sad. So I tend to cover up a lot.  How do I respond to well meaning but frustrating platitudes like, "Try again soon, dear. Another pregnancy will help." I would like to be pregnant again, but I believe a pregnancy should happen at a time of strength, not sadness.  How do I respond to silence and awkward attempts to avoid the whole issue? (You may be the one that decides to bring up the topic. Friends, relatives and acquaintances often do not know how to respond, so they say nothing).  I'm fine until my period comes. It is such a start reminder of the part of me that is lost.  My husband/wife and I always enjoyed a good sex life. Since my miscarriage, I have a hard time allowing myself to feel loving and sensuous. After all if such a loving and pleasurable experience as intercourse started something that ended in such a tragedy, it's too scary to think it might happen again. Why doesn't my husband/wife understand?  I have to face reality. Should I just force myself to attend my friend's baby shower?  Everyone was so supportive and caring when I was in the hospital. That was  four months ago. I can't burden others with my sadness now. Why am I still sad? I should  be over this thing by now. Knowing common facts about miscarriage probably will not blot out your negative feelings - denial, anger, disappointment, sadness - but facts can hold some comfort and can help cushion the sadness and guilt. For those wanting a child, it is frustrating and intensely disappointing to experience any miscarriage. However, most pregnancies that end in the first three months are imperfect in some way and, regardless of any precaution or intervention, are incapable of surviving and growing beyond approximately 14 weeks. This fact may not ease your sense of emptiness and sadness, but it may help ease your guilt and sense of assumed responsibility. Knowing clearly that you did not cause this to happen, that you are not directly responsible of your miscarriage by what you did or did not do, can bring a sense of relief and relaxation to your anxieties or guilt. A pregnancy that ends in later months is uniquely difficult also. Your protruding abdomen, your child's movements and heartbeat, are concrete proof of your expectant parenthood. A miscarriage occurring at 6 weeks or at 6 months causes a sudden change of events and feelings. Understandably, you may feel robbed or cheated of a promise of what was to be. It's easy to think that everyone else can have children. Surely I've been tricked or badly treated, you think. A fact not commonly know is that at least one in five pregnancies end in miscarriage, which indicates you are not alone. In a room of 25 couples, the odds are that 4 have experienced a miscarriage. Of these 8 people, many have shared your experience and feelings. Of course, this is not an easy topic to talk about, therefore it can appear that miscarriages rarely happen and that you are the only one coping with this loss. Realizing you are note alone can perhaps ease some of  the shock and disbelief of "WHY ME?" Three actions will help: 1. Giver yourself permission to be sad. It is possible to be sad without becoming chronically depressed. Set limits for yourself if you are concerned about becoming overly depressed. If the sadness begins to wash over you while at work at 2 p.m., tell yourself that you cannot deal with it then, but make an agreement with yourself that at 8 p.m. that night you will. And then do it. Many individuals report a beginning sense of control over their grief when they realize they can create appropriate and private times to be sad, alone, or with a significant other person. If you think you may be severely depressed (i.e. having difficulty getting out of bed in the morning, withdrawal from friends and relatives, insomnia, extended loss of appetite or overeating, loss of the ability to enjoy anything), this problem is probably not something you can handle yourself. It is important to seek out professional help at this time, or at any time when you are feeling particularly discouraged about your coping. 2. Find a supportive, trusted person and talk about your feelings and your questions. Build a support system, whether it be your spouse, partner, friend, relative, colleague, clergyman, support group or Pharmacist, hospital. You may still feel some pangs of sadness when attending a baby shower, celebrating a holiday, seeing other pregnant couples or passing the date of your expected delivery, but once you work through your loss, coping with your situation will become easier. 3. Also part of the process of life itself, is understanding and accepting incongruent or seemingly contradictory feelings. As one insightful women expressed, "Being happy for  a friend who has just had a baby (or twins!) is a genuine feeling, but also resentment, anger and frustration are there, all wrapped into one package;" two apparently conflicting feelings, but  both can exist, be recognized, and accepted. A miscarriage is an unexpected, often unexplained, death; with any loss you need to give yourself permission to grieve, to be angry, sad or relieved. There is no one appropriate reaction. Acknowledging your feelings that are real and understandable, though they may not be logical or rational, is part of the grieving process. Allowing the grieving process to happen is a positive way of coping and leads to health resolve and strength to look ahead. There may be times when you may not be able to put words to your feelings, but that does not need to stop you from seeking out and talking with an understanding person. Building your own support system will help you regain confidence and strength within yourself. RECOMMENDED READING The following materials are all available through [employer], [address] [place], [place] [address]; phone [phone]. For Adults: Empty Arms by Peri Maris Empty Cradle, Broken Heart by Doyne Keel Ended Beginnings by C. Panuthos & C. Romeo Miscarriage a Probation officer. Resource Miscarriage - A Shattered Dream by Peri Maris Newborn Death a Probation officer. Resource Still To Be Born by [employer] When a Doctor, hospital by [employer]. Church et al (TCF) When [employer] Goodbye by P. Schwiebert and Mort Sawyers When Pregnancy Fails by Kathie Rhodes Borg & Edwyna Ready For Helping Other Children How Do We Tell the Children? by Shaefer & Nunzio Cory No New Baby by [employer] Talking About Death by Freddi Che Where's Jess?? A [employer]. Resource Other helpful materials available locally at [employer]: The Fall of Freddy the Leaf by Reggie Pile, PhD, [employer]. (for children) When Filbert Berthold is Forever - Learning to Live Again After the Loss of a Child by Carlyon Prows, [employer] Books

## 2018-03-27 ENCOUNTER — Ambulatory Visit: Payer: Medicaid Other | Admitting: Obstetrics and Gynecology

## 2018-03-27 ENCOUNTER — Other Ambulatory Visit: Payer: Medicaid Other

## 2018-06-07 HISTORY — DX: Maternal care for unspecified type scar from previous cesarean delivery: O34.219

## 2019-01-05 ENCOUNTER — Ambulatory Visit: Payer: Medicaid Other

## 2019-01-05 ENCOUNTER — Ambulatory Visit (INDEPENDENT_AMBULATORY_CARE_PROVIDER_SITE_OTHER): Payer: Self-pay | Admitting: Obstetrics and Gynecology

## 2019-01-05 ENCOUNTER — Encounter: Payer: Medicaid Other | Admitting: Obstetrics and Gynecology

## 2019-01-05 ENCOUNTER — Encounter: Payer: Self-pay | Admitting: Obstetrics and Gynecology

## 2019-01-05 ENCOUNTER — Other Ambulatory Visit (HOSPITAL_COMMUNITY)
Admission: RE | Admit: 2019-01-05 | Discharge: 2019-01-05 | Disposition: A | Discharge status: Home / Self Care | Payer: Medicaid Other | Source: Ambulatory Visit | Attending: Obstetrics and Gynecology | Admitting: Obstetrics and Gynecology

## 2019-01-05 ENCOUNTER — Other Ambulatory Visit: Payer: Self-pay

## 2019-01-05 VITALS — BP 140/88 | Wt 243.0 lb

## 2019-01-05 DIAGNOSIS — Z130 Encounter for screening for diseases of the blood and blood-forming organs and certain disorders involving the immune mechanism: Secondary | ICD-10-CM

## 2019-01-05 DIAGNOSIS — Z3201 Encounter for pregnancy test, result positive: Secondary | ICD-10-CM

## 2019-01-05 DIAGNOSIS — F329 Major depressive disorder, single episode, unspecified: Secondary | ICD-10-CM

## 2019-01-05 DIAGNOSIS — Z3A13 13 weeks gestation of pregnancy: Secondary | ICD-10-CM

## 2019-01-05 DIAGNOSIS — Z124 Encounter for screening for malignant neoplasm of cervix: Secondary | ICD-10-CM

## 2019-01-05 DIAGNOSIS — Z3480 Encounter for supervision of other normal pregnancy, unspecified trimester: Secondary | ICD-10-CM | POA: Insufficient documentation

## 2019-01-05 DIAGNOSIS — F32A Depression, unspecified: Secondary | ICD-10-CM | POA: Insufficient documentation

## 2019-01-05 DIAGNOSIS — O99341 Other mental disorders complicating pregnancy, first trimester: Secondary | ICD-10-CM

## 2019-01-05 DIAGNOSIS — N912 Amenorrhea, unspecified: Secondary | ICD-10-CM

## 2019-01-05 HISTORY — DX: Encounter for supervision of other normal pregnancy, unspecified trimester: Z34.80

## 2019-01-05 LAB — POCT URINE PREGNANCY: Preg Test, Ur: POSITIVE — AB

## 2019-01-05 MED ORDER — SERTRALINE HCL 50 MG PO TABS: 50.0000 mg | ORAL_TABLET | Freq: Every day | ORAL | 11 refills | Status: DC

## 2019-01-05 NOTE — Progress Notes (Signed)
NOB C/o nausea no vomiting Started taking pregnancy test every week starting first week in July

## 2019-01-05 NOTE — Progress Notes (Signed)
01/05/2019   Chief Complaint: Missed period  Transfer of Care Patient: no  History of Present Illness: Brandi Solomon is a 28 y.o. [id number] 7236w0d based on Patient's last menstrual period was 10/06/2018 (exact date). with an Estimated Date of Delivery: 07/13/19, with the above CC.   Her periods were: regular periods every 28 days She was using no method when she conceived.  She has Positive signs or symptoms of nausea/vomiting of pregnancy. She has Negative signs or symptoms of miscarriage or preterm labor She was not taking different medications around the time she conceived/early pregnancy. Since her LMP, she has not used alcohol Since her LMP, she has not used tobacco products Since her LMP, she has not used illegal drugs.    Current or past history of domestic violence. no  Infection History:  1. Since her LMP, she has not had a viral illness.  2. She admits to close contact with children on a regular basis   3. She has a history of chicken pox. She reports vaccination for chicken pox in the past. 4. Patient or partner has history of genital herpes  no 5. History of STI (GC, CT, HPV, syphilis, HIV)  no  6.  She does not live with someone with TB or TB exposed. 7. History of recent travel :  no 8. She identifies Negative Zika risk factors for her and her partner 689. There are not cats in the home in the home.  She understands that while pregnant she should not change cat litter.   Genetic Screening Questions: (Includes patient, baby's father, or anyone in either family)   1. [facility] 7035 [facility]  no 2. Thalassemia ([place], [place], Mediterranean, or Asian background): MCV<80  no 3. Neural tube defect (meningomyelocele, spina bifida, anencephaly)  no 4. Congenital heart defect  no  5. Down syndrome  no 6. Tay-Sachs (Jewish, [place])  no 7. Canavan's Disease  no 8. Sickle cell disease or trait (African)  no sickle cell, does have african heritage  9. Hemophilia or  other blood disorders  no  10. Muscular dystrophy  no  11. Cystic fibrosis  no  12. Huntington's Chorea  no  13. Mental retardation/autism  no 14. Other inherited genetic or chromosomal disorder  no 15. Maternal metabolic disorder (DM, PKU, etc)  no 16. Patient or FOB with a child with a birth defect not listed above no  16a. Patient or FOB with a birth defect themselves no 17. Recurrent pregnancy loss, or stillbirth  yes  18. Any medications since LMP other than prenatal vitamins (include vitamins, supplements, OTC meds, drugs, alcohol)  no 19. Any other genetic/environmental exposure to discuss  no  ROS:  Review of Systems  Constitutional: Negative for chills, fever, malaise/fatigue and weight loss.  HENT: Negative for congestion, hearing loss and sinus pain.   Eyes: Negative for blurred vision and double vision.  Respiratory: Negative for cough, sputum production, shortness of breath and wheezing.   Cardiovascular: Negative for chest pain, palpitations, orthopnea and leg swelling.  Gastrointestinal: Negative for abdominal pain, constipation, diarrhea, nausea and vomiting.  Genitourinary: Negative for dysuria, flank pain, frequency, hematuria and urgency.  Musculoskeletal: Negative for back pain, falls and joint pain.  Skin: Negative for itching and rash.  Neurological: Negative for dizziness and headaches.  Psychiatric/Behavioral: Positive for depression. Negative for substance abuse and suicidal ideas. The patient is nervous/anxious.     OBGYN History: As per HPI. OB History  Gravida Para Term Preterm AB  Living  4 1 1   2 1   SAB TAB Ectopic Multiple Live Births  2       1    # Outcome Date GA Lbr Len/2nd Weight Sex Delivery Anes PTL Lv  4 Current           3 Term 08/01/08 [redacted]w[redacted]d   F CS-LTranv   LIV  2 SAB           1 SAB             Any issues with any prior pregnancies: no Any prior children are healthy, doing well, without any problems or issues: yes Last pap smear   Unknown History of STIs: No   Past Medical History: Past Medical History:  Diagnosis Date  . Asthma     Past Surgical History: Past Surgical History:  Procedure Laterality Date  . CESAREAN SECTION      Family History:  Family History  Problem Relation Age of Onset  . Throat cancer Maternal Aunt   . Skin cancer Maternal Aunt   . Hypertension Maternal Aunt   . Diabetes Mellitus II Maternal Aunt    She denies any female cancers, bleeding or blood clotting disorders.   Social History:  Social History   Socioeconomic History  . Marital status: Single    Spouse name: Not on file  . Number of children: Not on file  . Years of education: Not on file  . Highest education level: Not on file  Occupational History  . Not on file  Social Needs  . Financial resource strain: Not on file  . Food insecurity    Worry: Not on file    Inability: Not on file  . Transportation needs    Medical: Not on file    Non-medical: Not on file  Tobacco Use  . Smoking status: Current Every Day Smoker  . Smokeless tobacco: Never Used  . Tobacco comment: Quit since finding out she was pregnant.   Substance and Sexual Activity  . Alcohol use: Yes    Comment: Quit since finding out she was pregnant.   . Drug use: No  . Sexual activity: Yes    Birth control/protection: None  Lifestyle  . Physical activity    Days per week: Not on file    Minutes per session: Not on file  . Stress: Not on file  Relationships  . Social Herbalist on phone: Not on file    Gets together: Not on file    Attends religious service: Not on file    Active member of club or organization: Not on file    Attends meetings of clubs or organizations: Not on file    Relationship status: Not on file  . Intimate partner violence    Fear of current or ex partner: Not on file    Emotionally abused: Not on file    Physically abused: Not on file    Forced sexual activity: Not on file  Other Topics Concern  .  Not on file  Social History Narrative  . Not on file    Allergy: No Known Allergies  Current Outpatient Medications: No current outpatient medications on file.   Physical Exam: Physical Exam  Constitutional: She is oriented to person, place, and time and well-developed, well-nourished, and in no distress.  HENT:  Head: Normocephalic and atraumatic.  Eyes: Pupils are equal, round, and reactive to light.  Neck: Normal range of motion. Neck supple. No  thyromegaly present.  Cardiovascular: Normal rate and regular rhythm.  Pulmonary/Chest: Effort normal.  Abdominal: Soft. Bowel sounds are normal. She exhibits no distension. There is no abdominal tenderness. There is no rebound and no guarding.  Genitourinary:    Cervix and uterus normal.     Genitourinary Comments: Uterus 15 cm in size   Musculoskeletal: Normal range of motion.  Neurological: She is alert and oriented to person, place, and time.  Skin: Skin is warm and dry.  Psychiatric: Affect and judgment normal.  Nursing note and vitals reviewed.    Assessment: Brandi Solomon is a 28 y.o. [id number] 2684w0d based on Patient's last menstrual period was 10/06/2018 (exact date). with an Estimated Date of Delivery: 07/13/19,  for prenatal care.  Plan:  1) Avoid alcoholic beverages. 2) Patient encouraged not to smoke.  3) Discontinue the use of all non-medicinal drugs and chemicals.  4) Take prenatal vitamins daily.  5) Seatbelt use advised 6) Nutrition, food safety (fish, cheese advisories, and high nitrite foods) and exercise discussed. 7) Hospital and [facility] discussed  8) Patient is asked about travel to areas at risk for the Zika virus, and counseled to avoid travel and exposure to mosquitoes or sexual partners who may have themselves been exposed to the virus. Testing is discussed, and will be ordered as appropriate.  9) [facility] advised 10) Genetic Screening, such as with 1st Trimester  Screening, cell free fetal DNA, AFP testing, and Ultrasound, as well as with amniocentesis and CVS as appropriate, is discussed with patient. She plans to have genetic testing this pregnancy.  Could not do labs today because of insurance. [facility]   Will need to return when pregnancy medicaid is pending for labs and US.  Problem list reviewed and updated. Follow up in 2 weeks. Zoloft initiated for depression and anxiety.  GAD-7: 9 PHQ-9:12  I discussed the assessment and treatment plan with the patient. The patient was provided an opportunity to ask questions and all were answered. The patient agreed with the plan and demonstrated an understanding of the instructions.  Adelene Idlerhristanna Schuman MD [facility], [facility] 01/05/2019 3:44 PM

## 2019-01-07 LAB — URINE CULTURE: Organism ID, Bacteria: NO GROWTH

## 2019-01-08 LAB — MONITOR DRUG PROFILE 10(MW)
Amphetamine Scrn, Ur: NEGATIVE ng/mL
BARBITURATE SCREEN URINE: NEGATIVE ng/mL
BENZODIAZEPINE SCREEN, URINE: NEGATIVE ng/mL
CANNABINOIDS UR QL SCN: NEGATIVE ng/mL
Cocaine (Metab) Scrn, Ur: NEGATIVE ng/mL
Creatinine(Crt), U: 122.6 mg/dL (ref 20.0–300.0)
Methadone Screen, Urine: NEGATIVE ng/mL
OXYCODONE+OXYMORPHONE UR QL SCN: NEGATIVE ng/mL
Opiate Scrn, Ur: NEGATIVE ng/mL
Ph of Urine: 6.1 (ref 4.5–8.9)
Phencyclidine Qn, Ur: NEGATIVE ng/mL
Propoxyphene Scrn, Ur: NEGATIVE ng/mL

## 2019-01-11 LAB — CYTOLOGY - PAP
Chlamydia: NEGATIVE
Diagnosis: NEGATIVE
Neisseria Gonorrhea: NEGATIVE
Trichomonas: NEGATIVE

## 2019-01-12 ENCOUNTER — Other Ambulatory Visit: Payer: Self-pay | Admitting: Obstetrics and Gynecology

## 2019-01-12 DIAGNOSIS — N912 Amenorrhea, unspecified: Secondary | ICD-10-CM

## 2019-01-12 DIAGNOSIS — Z3480 Encounter for supervision of other normal pregnancy, unspecified trimester: Secondary | ICD-10-CM

## 2019-01-12 NOTE — Progress Notes (Signed)
PLEASE CALL PATIENT AND NOTIFY HER OF NORMAL RESULTS. Randall

## 2019-01-12 NOTE — Progress Notes (Signed)
wnl

## 2019-01-12 NOTE — Progress Notes (Signed)
Pt aware.

## 2019-01-19 ENCOUNTER — Ambulatory Visit (INDEPENDENT_AMBULATORY_CARE_PROVIDER_SITE_OTHER): Payer: Medicaid Other | Admitting: Maternal Newborn

## 2019-01-19 ENCOUNTER — Encounter: Payer: Self-pay | Admitting: Maternal Newborn

## 2019-01-19 ENCOUNTER — Ambulatory Visit (INDEPENDENT_AMBULATORY_CARE_PROVIDER_SITE_OTHER): Payer: Medicaid Other

## 2019-01-19 ENCOUNTER — Other Ambulatory Visit: Payer: Self-pay

## 2019-01-19 VITALS — BP 140/86 | Wt 241.0 lb

## 2019-01-19 DIAGNOSIS — Z3480 Encounter for supervision of other normal pregnancy, unspecified trimester: Secondary | ICD-10-CM

## 2019-01-19 DIAGNOSIS — Z3A12 12 weeks gestation of pregnancy: Secondary | ICD-10-CM

## 2019-01-19 DIAGNOSIS — Z363 Encounter for antenatal screening for malformations: Secondary | ICD-10-CM | POA: Diagnosis not present

## 2019-01-19 DIAGNOSIS — Z1379 Encounter for other screening for genetic and chromosomal anomalies: Secondary | ICD-10-CM

## 2019-01-19 DIAGNOSIS — Z3A15 15 weeks gestation of pregnancy: Secondary | ICD-10-CM

## 2019-01-19 DIAGNOSIS — O169 Unspecified maternal hypertension, unspecified trimester: Secondary | ICD-10-CM

## 2019-01-19 DIAGNOSIS — O162 Unspecified maternal hypertension, second trimester: Secondary | ICD-10-CM | POA: Diagnosis not present

## 2019-01-19 HISTORY — DX: Unspecified maternal hypertension, unspecified trimester: O16.9

## 2019-01-19 LAB — POCT URINALYSIS DIPSTICK OB
Glucose, UA: NEGATIVE
POC,PROTEIN,UA: NEGATIVE

## 2019-01-19 LAB — OB RESULTS CONSOLE VARICELLA ZOSTER ANTIBODY, IGG: Varicella: IMMUNE

## 2019-01-19 NOTE — Patient Instructions (Signed)

## 2019-01-19 NOTE — Progress Notes (Addendum)
Routine Prenatal Care Visit  Subjective  Brandi Solomon is a 28 y.o. G4P1021 at [redacted]w[redacted]d being seen today for ongoing prenatal care.  She is currently monitored for the following issues for this low-risk pregnancy and has Depression affecting pregnancy; Supervision of other normal pregnancy, antepartum; and Elevated blood pressure affecting pregnancy, antepartum on their problem list.  ----------------------------------------------------------------------------------- Patient reports nausea.   Contractions: Not present. Vag. Bleeding: None.  Movement: Absent. No leaking of fluid.  ----------------------------------------------------------------------------------- The following portions of the patient's history were reviewed and updated as appropriate: allergies, current medications, past family history, past medical history, past social history, past surgical history and problem list. Problem list updated.   Objective  Blood pressure 140/86, weight 241 lb (109.3 kg), last menstrual period 10/06/2018, unknown if currently breastfeeding. Pregravid weight 243 lb (110.2 kg) Total Weight Gain -2 lb (-0.907 kg) Urinalysis: Urine dipstick shows negative for glucose, protein.  Fetal Status: Fetal Heart Rate (bpm): 158 ([place])   Movement: Absent     General:  Alert, oriented and cooperative. Patient is in no acute distress.  Skin: Skin is warm and dry. No rash noted.   Cardiovascular: Normal heart rate noted  Respiratory: Normal respiratory effort, no problems with respiration noted  Abdomen: Soft, gravid, appropriate for gestational age. Pain/Pressure: Absent     Pelvic:  Cervical exam deferred        Extremities: Normal range of motion.     Mental Status: Normal mood and affect. Normal behavior. Normal judgment and thought content.     Assessment   28 y.o. [id number] at [redacted]w[redacted]d, EDD 07/13/2019 by Last Menstrual Period presenting for a routine prenatal visit.  Plan   pregnancy #4 Problems (from  10/06/18 to present)    Problem Noted Resolved   Depression affecting pregnancy 01/05/2019 by Homero Fellers, MD No   Supervision of other normal pregnancy, antepartum 01/05/2019 by Homero Fellers, MD No   Overview Signed 01/05/2019  4:30 PM by Homero Fellers, MD      [facility]inic [facility] Prenatal Labs  Dating  Blood type:     Genetic Screen 1 Screen:     AFP:      Quad:      NIPS:   Desired Antibody:   Anatomic [place]  Rubella:   Varicella: @VZVIGG @  GTT Early:        28 wk:      RPR:     Rhogam  HBsAg:     TDaP vaccine                       HIV:     Flu Shot                                GBS:   Contraception  Pap:  CBB     CS/VBAC    Baby Food    Support Person              Dating scan today shows singleton IUP at 12w 6 d gestation, FHT 158 bpm. Dating changed per ACOG Guidelines to 07/28/2019  Elevated BP today, will obtain CMP and PC ratio with NOB labs.  Sample given of Bonjesta for ongoing nausea, will call for Diclegis Rx if effective.  Genetic screening today, would like gender in an envelope.  Please refer to After Visit Summary for other counseling recommendations.   Return in about 4 weeks (  around 02/16/2019) for ROB/BP check.  Marcelyn BruinsJacelyn Julienne Vogler, CNM 01/19/2019  4:20 PM

## 2019-01-19 NOTE — Progress Notes (Signed)
ROB and Dating scan- no concerns 

## 2019-01-20 LAB — PROTEIN / CREATININE RATIO, URINE
Creatinine, Urine: 192.4 mg/dL
Protein, Ur: 11.7 mg/dL
Protein/Creat Ratio: 61 mg/g creat (ref 0–200)

## 2019-01-22 LAB — COMPREHENSIVE METABOLIC PANEL
ALT: 8 IU/L (ref 0–32)
AST: 11 IU/L (ref 0–40)
Albumin/Globulin Ratio: 1.4 (ref 1.2–2.2)
Albumin: 3.8 g/dL — ABNORMAL LOW (ref 3.9–5.0)
Alkaline Phosphatase: 51 IU/L (ref 39–117)
BUN/Creatinine Ratio: 16 (ref 9–23)
BUN: 10 mg/dL (ref 6–20)
Bilirubin Total: <0.2 mg/dL (ref 0.0–1.2)
CO2: 20 mmol/L (ref 20–29)
Calcium: 9.2 mg/dL (ref 8.7–10.2)
Chloride: 101 mmol/L (ref 96–106)
Creatinine, Ser: 0.63 mg/dL (ref 0.57–1.00)
GFR calc Af Amer: 142 mL/min/{1.73_m2} (ref >59)
GFR calc non Af Amer: 123 mL/min/{1.73_m2} (ref >59)
Globulin, Total: 2.8 g/dL (ref 1.5–4.5)
Glucose: 52 mg/dL — ABNORMAL LOW (ref 65–99)
Potassium: 4.2 mmol/L (ref 3.5–5.2)
Sodium: 137 mmol/L (ref 134–144)
Total Protein: 6.6 g/dL (ref 6.0–8.5)

## 2019-01-22 LAB — RPR+RH+ABO+RUB AB+AB SCR+CB...
Antibody Screen: NEGATIVE
HIV Screen 4th Generation wRfx: NONREACTIVE
Hematocrit: 37.3 % (ref 34.0–46.6)
Hemoglobin: 12.4 g/dL (ref 11.1–15.9)
Hepatitis B Surface Ag: NEGATIVE
MCH: 29.8 pg (ref 26.6–33.0)
MCHC: 33.2 g/dL (ref 31.5–35.7)
MCV: 90 fL (ref 79–97)
Platelets: 307 10*3/uL (ref 150–450)
RBC: 4.16 x10E6/uL (ref 3.77–5.28)
RDW: 13.7 % (ref 11.7–15.4)
RPR Ser Ql: NONREACTIVE
Rh Factor: POSITIVE
Rubella Antibodies, IGG: 2.18 index (ref >0.99)
Varicella zoster IgG: 1229 index (ref >165)
WBC: 7.4 10*3/uL (ref 3.4–10.8)

## 2019-01-22 LAB — HEMOGLOBINOPATHY EVALUATION
HGB C: 0 %
HGB S: 0 %
HGB VARIANT: 0 %
Hemoglobin A2 Quantitation: 2.3 % (ref 1.8–3.2)
Hemoglobin F Quantitation: 0 % (ref 0.0–2.0)
Hgb A: 97.7 % (ref 96.4–98.8)

## 2019-01-25 LAB — MATERNIT 21 PLUS CORE, BLOOD
Fetal Fraction: 4
Result (T21): NEGATIVE
Trisomy 13 (Patau syndrome): NEGATIVE
Trisomy 18 (Edwards syndrome): NEGATIVE
Trisomy 21 (Down syndrome): NEGATIVE

## 2019-02-16 ENCOUNTER — Other Ambulatory Visit: Payer: Self-pay

## 2019-02-16 ENCOUNTER — Encounter: Payer: Self-pay | Admitting: Maternal Newborn

## 2019-02-16 ENCOUNTER — Ambulatory Visit (INDEPENDENT_AMBULATORY_CARE_PROVIDER_SITE_OTHER): Payer: Medicaid Other | Admitting: Maternal Newborn

## 2019-02-16 VITALS — BP 110/70 | Wt 243.0 lb

## 2019-02-16 DIAGNOSIS — Z3480 Encounter for supervision of other normal pregnancy, unspecified trimester: Secondary | ICD-10-CM

## 2019-02-16 DIAGNOSIS — Z3482 Encounter for supervision of other normal pregnancy, second trimester: Secondary | ICD-10-CM

## 2019-02-16 DIAGNOSIS — Z3689 Encounter for other specified antenatal screening: Secondary | ICD-10-CM

## 2019-02-16 DIAGNOSIS — Z3A16 16 weeks gestation of pregnancy: Secondary | ICD-10-CM

## 2019-02-16 LAB — POCT URINALYSIS DIPSTICK OB
Glucose, UA: NEGATIVE
POC,PROTEIN,UA: NEGATIVE

## 2019-02-16 NOTE — Progress Notes (Signed)
    Routine Prenatal Care Visit  Subjective  Brandi Solomon is a 28 y.o. G4P1021 at [redacted]w[redacted]d being seen today for ongoing prenatal care.  She is currently monitored for the following issues for this low-risk pregnancy and has Depression affecting pregnancy; Supervision of other normal pregnancy, antepartum; and Elevated blood pressure affecting pregnancy, antepartum on their problem list.  ----------------------------------------------------------------------------------- Patient reports intermittent cramping.   Contractions: Not present. Vag. Bleeding: None.  Movement: Absent. No leaking of fluid.  ----------------------------------------------------------------------------------- The following portions of the patient's history were reviewed and updated as appropriate: allergies, current medications, past family history, past medical history, past social history, past surgical history and problem list. Problem list updated.   Objective  Blood pressure 110/70, weight 243 lb (110.2 kg), last menstrual period 10/06/2018, unknown if currently breastfeeding. Pregravid weight 243 lb (110.2 kg) Total Weight Gain 0 lb (0 kg) Urinalysis: Urine dipstick shows negative for glucose, protein.  Fetal Status: Fetal Heart Rate (bpm): 153   Movement: Absent     General:  Alert, oriented and cooperative. Patient is in no acute distress.  Skin: Skin is warm and dry. No rash noted.   Cardiovascular: Normal heart rate noted  Respiratory: Normal respiratory effort, no problems with respiration noted  Abdomen: Soft, gravid, appropriate for gestational age. Pain/Pressure: Absent     Pelvic:  Cervical exam deferred        Extremities: Normal range of motion.  Edema: None  Mental Status: Normal mood and affect. Normal behavior. Normal judgment and thought content.     Assessment   28 y.o. [id number] at [redacted]w[redacted]d, EDD 07/28/2019 by Ultrasound presenting for a routine prenatal visit.  Plan   pregnancy #4 Problems  (from 10/06/18 to present)    Problem Noted Resolved   Depression affecting pregnancy 01/05/2019 by Homero Fellers, MD No   Supervision of other normal pregnancy, antepartum 01/05/2019 by Homero Fellers, MD No   Overview Signed 01/05/2019  4:30 PM by Homero Fellers, MD      [facility]inic [facility] Prenatal Labs  Dating  Blood type:     Genetic Screen 1 Screen:     AFP:      Quad:      NIPS:   Desired Antibody:   Anatomic [place]  Rubella:   Varicella: @VZVIGG @  GTT Early:        28 wk:      RPR:     Rhogam  HBsAg:     TDaP vaccine                       HIV:     Flu Shot                                GBS:   Contraception  Pap:  CBB     CS/VBAC    Baby Food    Support Person              Normotensive this visit, and labs were reassuring. Anatomy scan next visit.   Please refer to After Visit Summary for other counseling recommendations.   Return in about 4 weeks (around 03/16/2019) for ROB and anatomy scan.  Avel Sensor, CNM 02/16/2019  9:15 AM

## 2019-02-16 NOTE — Progress Notes (Signed)
ROB/BP check- for the past couple days lower abdominal cramping on right side, no spotting/bleeding

## 2019-02-16 NOTE — Patient Instructions (Signed)

## 2019-03-14 ENCOUNTER — Ambulatory Visit (INDEPENDENT_AMBULATORY_CARE_PROVIDER_SITE_OTHER): Payer: Medicaid Other | Admitting: Obstetrics and Gynecology

## 2019-03-14 ENCOUNTER — Encounter: Payer: Self-pay | Admitting: Obstetrics and Gynecology

## 2019-03-14 ENCOUNTER — Ambulatory Visit (INDEPENDENT_AMBULATORY_CARE_PROVIDER_SITE_OTHER): Payer: Medicaid Other

## 2019-03-14 ENCOUNTER — Ambulatory Visit: Payer: Medicaid Other

## 2019-03-14 ENCOUNTER — Other Ambulatory Visit: Payer: Self-pay

## 2019-03-14 ENCOUNTER — Encounter: Payer: Medicaid Other | Admitting: Advanced Practice Midwife

## 2019-03-14 VITALS — BP 134/80 | Wt 244.0 lb

## 2019-03-14 DIAGNOSIS — Z23 Encounter for immunization: Secondary | ICD-10-CM

## 2019-03-14 DIAGNOSIS — Z3480 Encounter for supervision of other normal pregnancy, unspecified trimester: Secondary | ICD-10-CM

## 2019-03-14 DIAGNOSIS — Z363 Encounter for antenatal screening for malformations: Secondary | ICD-10-CM

## 2019-03-14 DIAGNOSIS — Z3A20 20 weeks gestation of pregnancy: Secondary | ICD-10-CM

## 2019-03-14 DIAGNOSIS — Z3482 Encounter for supervision of other normal pregnancy, second trimester: Secondary | ICD-10-CM

## 2019-03-14 NOTE — Progress Notes (Signed)
    Routine Prenatal Care Visit  Subjective  Brandi Solomon is a 28 y.o. G4P1021 at [redacted]w[redacted]d being seen today for ongoing prenatal care.  She is currently monitored for the following issues for this low-risk pregnancy and has Depression affecting pregnancy; Supervision of other normal pregnancy, antepartum; and Elevated blood pressure affecting pregnancy, antepartum on their problem list.  ----------------------------------------------------------------------------------- Patient reports no complaints.   Contractions: Not present. Vag. Bleeding: None.   . Denies leaking of fluid.  ----------------------------------------------------------------------------------- The following portions of the patient's history were reviewed and updated as appropriate: allergies, current medications, past family history, past medical history, past social history, past surgical history and problem list. Problem list updated.   Objective  Blood pressure 134/80, weight 244 lb (110.7 kg), last menstrual period 10/06/2018, unknown if currently breastfeeding. Pregravid weight 243 lb (110.2 kg) Total Weight Gain 1 lb (0.454 kg) Urinalysis:      Fetal Status:           General:  Alert, oriented and cooperative. Patient is in no acute distress.  Skin: Skin is warm and dry. No rash noted.   Cardiovascular: Normal heart rate noted  Respiratory: Normal respiratory effort, no problems with respiration noted  Abdomen: Soft, gravid, appropriate for gestational age. Pain/Pressure: Absent     Pelvic:  Cervical exam deferred        Extremities: Normal range of motion.  Edema: None  Mental Status: Normal mood and affect. Normal behavior. Normal judgment and thought content.     Assessment   28 y.o. X3G1829 at [redacted]w[redacted]d by  07/28/2019, by Ultrasound presenting for routine prenatal visit  Plan   pregnancy #4 Problems (from 10/06/18 to present)    Problem Noted Resolved   Depression affecting pregnancy 01/05/2019 by  Homero Fellers, MD No   Supervision of other normal pregnancy, antepartum 01/05/2019 by Homero Fellers, MD No   Overview Addendum 03/14/2019  4:23 PM by Homero Fellers, [place] Prenatal Labs  Dating  12 wk [place] Blood type: A/Positive/-- (08/14 1611)   Genetic Screen NIPS: Negative XX Antibody:Negative (08/14 1611)  Anatomic [place] incomplete Rubella: 2.18 (08/14 1611) Varicella: Immune  GTT Early:        28 wk:      RPR: Non Reactive (08/14 1611)   Rhogam  not needed HBsAg: Negative (08/14 1611)   TDaP vaccine                       HIV: Non Reactive (08/14 1611)   Flu Shot   03/14/2019                             GBS:   Contraception  Pap: 01/05/2019 NILM  CBB     CS/VBAC    Baby Food    Support Person                 Gestational age appropriate obstetric precautions including but not limited to vaginal bleeding, contractions, leaking of fluid and fetal movement were reviewed in detail with the patient.    Referral to MFM for incomplete [place].  Return in about 4 weeks (around 04/11/2019) for ROB in person.  Homero Fellers MD [facility], [facility] 03/14/2019, 4:22 PM

## 2019-03-14 NOTE — Progress Notes (Signed)
ROB/Anatomy No concerns  denies lof, no vb Some flutters  Declines flu shot

## 2019-03-15 ENCOUNTER — Telehealth: Payer: Self-pay

## 2019-03-15 NOTE — Telephone Encounter (Signed)
Called and discussed why she needed the follow up images with MFM. Mom satisfied and appreciated the response.

## 2019-03-15 NOTE — Telephone Encounter (Signed)
Belenda Cruise, pt's mom, calling. Pt was seen yesterday for u/s and f/u.  At the f/u pt was told she is being referred to [facility] for u/s of fetal heart.  Is there something wrong with the baby's heart?  Angie usually comes to appts c pt b/c the pt doesn't know how to go about asking questions nor is she good at relaying information.  D/t Covid, Angie has not been allowed to come with pt.  Please call Angie - has questions and wants detailed answers please.  [phone] Angie is on DNR.

## 2019-03-15 NOTE — Telephone Encounter (Signed)
Please advise. Thank you

## 2019-03-16 ENCOUNTER — Encounter: Payer: Medicaid Other | Admitting: Obstetrics and Gynecology

## 2019-03-16 ENCOUNTER — Ambulatory Visit: Payer: Medicaid Other

## 2019-03-22 ENCOUNTER — Other Ambulatory Visit: Payer: Self-pay

## 2019-03-22 DIAGNOSIS — Z3480 Encounter for supervision of other normal pregnancy, unspecified trimester: Secondary | ICD-10-CM

## 2019-03-26 ENCOUNTER — Ambulatory Visit
Admission: RE | Admit: 2019-03-26 | Discharge: 2019-03-26 | Disposition: A | Discharge status: Home / Self Care | Payer: Medicaid Other | Source: Ambulatory Visit | Attending: Obstetrics and Gynecology | Admitting: Obstetrics and Gynecology

## 2019-03-26 ENCOUNTER — Ambulatory Visit (HOSPITAL_BASED_OUTPATIENT_CLINIC_OR_DEPARTMENT_OTHER)
Admission: RE | Admit: 2019-03-26 | Discharge: 2019-03-26 | Disposition: A | Discharge status: Home / Self Care | Payer: Medicaid Other | Source: Ambulatory Visit | Attending: Obstetrics and Gynecology | Admitting: Obstetrics and Gynecology

## 2019-03-26 ENCOUNTER — Other Ambulatory Visit: Payer: Self-pay

## 2019-03-26 DIAGNOSIS — Z3480 Encounter for supervision of other normal pregnancy, unspecified trimester: Secondary | ICD-10-CM | POA: Diagnosis not present

## 2019-03-26 DIAGNOSIS — Z3A22 22 weeks gestation of pregnancy: Secondary | ICD-10-CM | POA: Insufficient documentation

## 2019-03-26 DIAGNOSIS — Z8249 Family history of ischemic heart disease and other diseases of the circulatory system: Secondary | ICD-10-CM | POA: Diagnosis not present

## 2019-03-26 DIAGNOSIS — O99210 Obesity complicating pregnancy, unspecified trimester: Secondary | ICD-10-CM

## 2019-03-26 DIAGNOSIS — O169 Unspecified maternal hypertension, unspecified trimester: Secondary | ICD-10-CM | POA: Diagnosis not present

## 2019-03-26 DIAGNOSIS — O99340 Other mental disorders complicating pregnancy, unspecified trimester: Secondary | ICD-10-CM

## 2019-03-26 DIAGNOSIS — O99343 Other mental disorders complicating pregnancy, third trimester: Secondary | ICD-10-CM | POA: Diagnosis not present

## 2019-03-26 DIAGNOSIS — J45909 Unspecified asthma, uncomplicated: Secondary | ICD-10-CM | POA: Diagnosis not present

## 2019-03-26 DIAGNOSIS — O99332 Smoking (tobacco) complicating pregnancy, second trimester: Secondary | ICD-10-CM | POA: Diagnosis not present

## 2019-03-26 DIAGNOSIS — Z87891 Personal history of nicotine dependence: Secondary | ICD-10-CM | POA: Insufficient documentation

## 2019-03-26 DIAGNOSIS — O34219 Maternal care for unspecified type scar from previous cesarean delivery: Secondary | ICD-10-CM

## 2019-03-26 DIAGNOSIS — O99512 Diseases of the respiratory system complicating pregnancy, second trimester: Secondary | ICD-10-CM | POA: Insufficient documentation

## 2019-03-26 DIAGNOSIS — F17210 Nicotine dependence, cigarettes, uncomplicated: Secondary | ICD-10-CM | POA: Insufficient documentation

## 2019-03-26 DIAGNOSIS — O162 Unspecified maternal hypertension, second trimester: Secondary | ICD-10-CM | POA: Insufficient documentation

## 2019-03-26 DIAGNOSIS — F329 Major depressive disorder, single episode, unspecified: Secondary | ICD-10-CM | POA: Diagnosis not present

## 2019-03-26 DIAGNOSIS — O99212 Obesity complicating pregnancy, second trimester: Secondary | ICD-10-CM

## 2019-03-26 DIAGNOSIS — J4520 Mild intermittent asthma, uncomplicated: Secondary | ICD-10-CM

## 2019-03-26 HISTORY — DX: Obesity complicating pregnancy, unspecified trimester: O99.210

## 2019-03-26 NOTE — Progress Notes (Signed)
[facility] Consultation   Chief Complaint: Elevated blood pressure, history of depression. H/o cesarean for breech   HPI: [name] is a 28 y.o. [phone] WF at [redacted]w[redacted]d by earliest scan  who [facility]   for elevated BMI, labile BP   Patient has a history of asthma she had a emergency room visit in 2018 where she required steroids she has never been hospitalized or intubated.  She quit smoking after finding out she was pregnant.  Her mother accompanying her and I did not discuss her history of depression in detail she denies being on any medications for depression or having depressed that affect currently.  She did become distraught appearing discussing her infant that was born in 2010 lives in [place] with her father.  Her mother said that she has 1-1/2 grandchildren, since she does not count the child  in [place] since there is no contact.   Past Medical History: Patient  has a past medical history of Asthma.  Past Surgical History: She  has a past surgical history that includes Cesarean section.  Obstetric History:  OB History    Gravida  4   Para  1   Term  1   Preterm      AB  2   Living  1     SAB  2   TAB      Ectopic      Multiple      Live Births  1         2010 cesarean section for breech at term - that child lives with her father in [place] .  Pt notes that she was on Depo-Provera and was surprised to find out she was pregnant so had late prenatal care  -History of early miscarriage in 2017 -History of miscarriage in 2019 Gynecologic History:  Patient's last menstrual period was 10/06/2018 (exact date).    Medications:  Current Outpatient Medications:  .  Prenatal Vit-Fe Fumarate-FA (MULTIVITAMIN-PRENATAL) 27-0.8 MG TABS tablet, Take 1 tablet by mouth daily at 12 noon., Disp: , Rfl:  Allergies: Patient has No Known Allergies.  Social History: Patient  reports that she has been smoking. She has never  used smokeless tobacco. She reports current alcohol use. She reports that she does not use drugs.  Family History: family history includes Diabetes Mellitus II in her maternal aunt; Hypertension in her maternal aunt; Skin cancer in her maternal aunt; Throat cancer in her maternal aunt.  Review of Systems A full 12 point review of systems was negative or as noted in the History of Present Illness.  Physical Exam: LMP 10/06/2018 (Exact Date)  Height 5 feet 11 inches Weight 245.5 BMI 34 Initial BP 131/91 recheck 117/75 Pulse 98 respiratory rate 18 pulse ox 97%  Well-appearing white female moderately overweight  Asessement: 1. Supervision of other normal pregnancy, antepartum   2. Elevated blood pressure affecting pregnancy, antepartum   3. Depression affecting pregnancy   4-history of cesarean section for breech patient is interested in a trial of labor after cesarean 5 elevated BMI 34 6 former smoker-partner still smokes he is trying to do it outside we discussed strategies to encourage him to engage in smoking cessation.  She may bring him to the visit and asked her primary providers to also counsel him. 7 history of asthma-patient reports no current use of medications but is noted in her chart in 2017 avoid use of Hemabate  Plan: -We discussed labile blood pressure  in pregnancy.  Patient states that she is never been diagnosed with elevated blood pressure and has never been on blood pressure medicine.  She states that frequently she rushes into clinic and then gets nervous and has an elevated blood pressure usually on repeat is back to normal.  I offered the option of a baby aspirin 81 mg to take daily that it may decrease the risk of preeclampsia by 30 to 40%.  Patient reports she had no toxemia in her first pregnancy.  -We discussed VBAC-I reviewed the  1% risk of rupture if the prior cesarean was a low transverse cesarean section (given it was performed at term and was nonemergent it  likely was LTCS) the MFM you calculator suggest a 66% chance of success  -I recommended she limit her weight gain to 10 to 15 pounds.  Patient expresses that [place] fries are her weakness and we discussed trying to limit that  -Patient reports her mood is currently good her mother is with her and we did not go into depth discussing her history of depression.  She reports she is taking no medications.  Her mood did decline as we discussed the delivery of her prior daughter and the fact that she is living away from her.  This may be a source of depression for her after delivery.  -Asthma seems well controlled avoid Hemabate -Follow-up scan for growth was ordered in 6 to 8 weeks.  As scheduled let at the [facility] perinatal office.   Total time spent with the patient was 30 minutes with greater than 50% spent in counseling and coordination of care. We appreciate this interesting consult and will be happy to be involved in the ongoing care of [name] in anyway her obstetricians desire.  Jimmey Ralph, MD Maternal-Fetal Medicine [facility]

## 2019-04-11 ENCOUNTER — Ambulatory Visit (INDEPENDENT_AMBULATORY_CARE_PROVIDER_SITE_OTHER): Payer: Medicaid Other | Admitting: Obstetrics and Gynecology

## 2019-04-11 ENCOUNTER — Other Ambulatory Visit: Payer: Self-pay

## 2019-04-11 VITALS — BP 126/84 | Wt 245.0 lb

## 2019-04-11 DIAGNOSIS — Z3A24 24 weeks gestation of pregnancy: Secondary | ICD-10-CM

## 2019-04-11 DIAGNOSIS — O34219 Maternal care for unspecified type scar from previous cesarean delivery: Secondary | ICD-10-CM

## 2019-04-11 DIAGNOSIS — Z3480 Encounter for supervision of other normal pregnancy, unspecified trimester: Secondary | ICD-10-CM

## 2019-04-11 NOTE — Progress Notes (Signed)
No vb. No lof.  

## 2019-04-11 NOTE — Progress Notes (Signed)
Routine Prenatal Care Visit  Subjective  Brandi Solomon is a 28 y.o. G4P1021 at [redacted]w[redacted]d being seen today for ongoing prenatal care.  She is currently monitored for the following issues for this low-risk pregnancy and has Depression affecting pregnancy; Supervision of other normal pregnancy, antepartum; Elevated blood pressure affecting pregnancy, antepartum; Previous cesarean delivery, antepartum condition or complication; Asthma; Obesity affecting pregnancy; and Former smoker on their problem list.  ----------------------------------------------------------------------------------- Patient reports no complaints.   Contractions: Not present. Vag. Bleeding: None.  Movement: Present. Denies leaking of fluid.  ----------------------------------------------------------------------------------- The following portions of the patient's history were reviewed and updated as appropriate: allergies, current medications, past family history, past medical history, past social history, past surgical history and problem list. Problem list updated.   Objective  Blood pressure 126/84, weight 245 lb (111.1 kg), last menstrual period 10/06/2018, unknown if currently breastfeeding. Pregravid weight 243 lb (110.2 kg) Total Weight Gain 2 lb (0.907 kg) Urinalysis:      Fetal Status: Fetal Heart Rate (bpm): 141 Fundal Height: 27 cm Movement: Present     General:  Alert, oriented and cooperative. Patient is in no acute distress.  Skin: Skin is warm and dry. No rash noted.   Cardiovascular: Normal heart rate noted  Respiratory: Normal respiratory effort, no problems with respiration noted  Abdomen: Soft, gravid, appropriate for gestational age. Pain/Pressure: Absent     Pelvic:  Cervical exam deferred        Extremities: Normal range of motion.  Edema: None  Mental Status: Normal mood and affect. Normal behavior. Normal judgment and thought content.     Assessment   28 y.o. A1O8786 at [redacted]w[redacted]d by  07/28/2019,  by Ultrasound presenting for routine prenatal visit  Plan   pregnancy #4 Problems (from 10/06/18 to present)    Problem Noted Resolved   Depression affecting pregnancy 01/05/2019 by Natale Milch, MD No   Supervision of other normal pregnancy, antepartum 01/05/2019 by Natale Milch, MD No   Overview Addendum 03/14/2019  4:23 PM by Natale Milch, MD      Clinic [facility] Prenatal Labs  Dating  12 wk [place] Blood type: A/Positive/-- (08/14 1611)   Genetic Screen NIPS: Negative XX Antibody:Negative (08/14 1611)  Anatomic [place] incomplete Rubella: 2.18 (08/14 1611) Varicella: Immune  GTT Early:        28 wk:      RPR: Non Reactive (08/14 1611)   Rhogam  not needed HBsAg: Negative (08/14 1611)   TDaP vaccine                       HIV: Non Reactive (08/14 1611)   Flu Shot   03/14/2019                             GBS:   Contraception  Pap: 01/05/2019 NILM  CBB     CS/VBAC    Baby Food    Support Person                 Gestational age appropriate obstetric precautions including but not limited to vaginal bleeding, contractions, leaking of fluid and fetal movement were reviewed in detail with the patient.    Patient reports she would like to transfer care to [facility] practice because this is closer to her home.. Told this would be fine and she should schedule an appointment to transfer as soon as possible.   Return in  about 4 weeks (around 05/09/2019) for ROB in person, 1 GTT.  Homero Fellers MD [facility] 04/11/2019, 10:42 AM

## 2019-05-09 ENCOUNTER — Ambulatory Visit (INDEPENDENT_AMBULATORY_CARE_PROVIDER_SITE_OTHER): Payer: Medicaid Other | Admitting: Advanced Practice Midwife

## 2019-05-09 ENCOUNTER — Other Ambulatory Visit: Payer: Medicaid Other

## 2019-05-09 ENCOUNTER — Other Ambulatory Visit: Payer: Self-pay

## 2019-05-09 ENCOUNTER — Encounter: Payer: Self-pay | Admitting: Advanced Practice Midwife

## 2019-05-09 VITALS — BP 122/84 | Wt 246.0 lb

## 2019-05-09 DIAGNOSIS — Z3480 Encounter for supervision of other normal pregnancy, unspecified trimester: Secondary | ICD-10-CM

## 2019-05-09 DIAGNOSIS — O34219 Maternal care for unspecified type scar from previous cesarean delivery: Secondary | ICD-10-CM

## 2019-05-09 DIAGNOSIS — Z3A28 28 weeks gestation of pregnancy: Secondary | ICD-10-CM

## 2019-05-09 NOTE — Progress Notes (Signed)
No vb. No lof. 28 week labs today.  

## 2019-05-09 NOTE — Patient Instructions (Signed)
Third Trimester of Pregnancy The third trimester is from week 28 through week 40 (months 7 through 9). The third trimester is a time when the unborn baby (fetus) is growing rapidly. At the end of the ninth month, the fetus is about 20 inches in length and weighs 6-10 pounds. Body changes during your third trimester Your body will continue to go through many changes during pregnancy. The changes vary from woman to woman. During the third trimester:  Your weight will continue to increase. You can expect to gain 25-35 pounds (11-16 kg) by the end of the pregnancy.  You may begin to get stretch marks on your hips, abdomen, and breasts.  You may urinate more often because the fetus is moving lower into your pelvis and pressing on your bladder.  You may develop or continue to have heartburn. This is caused by increased hormones that slow down muscles in the digestive tract.  You may develop or continue to have constipation because increased hormones slow digestion and cause the muscles that push waste through your intestines to relax.  You may develop hemorrhoids. These are swollen veins (varicose veins) in the rectum that can itch or be painful.  You may develop swollen, bulging veins (varicose veins) in your legs.  You may have increased body aches in the pelvis, back, or thighs. This is due to weight gain and increased hormones that are relaxing your joints.  You may have changes in your hair. These can include thickening of your hair, rapid growth, and changes in texture. Some women also have hair loss during or after pregnancy, or hair that feels dry or thin. Your hair will most likely return to normal after your baby is born.  Your breasts will continue to grow and they will continue to become tender. A yellow fluid (colostrum) may leak from your breasts. This is the first milk you are producing for your baby.  Your belly button may stick out.  You may notice more swelling in your hands,  face, or ankles.  You may have increased tingling or numbness in your hands, arms, and legs. The skin on your belly may also feel numb.  You may feel short of breath because of your expanding uterus.  You may have more problems sleeping. This can be caused by the size of your belly, increased need to urinate, and an increase in your body's metabolism.  You may notice the fetus "dropping," or moving lower in your abdomen (lightening).  You may have increased vaginal discharge.  You may notice your joints feel loose and you may have pain around your pelvic bone. What to expect at prenatal visits You will have prenatal exams every 2 weeks until week 36. Then you will have weekly prenatal exams. During a routine prenatal visit:  You will be weighed to make sure you and the baby are growing normally.  Your blood pressure will be taken.  Your abdomen will be measured to track your baby's growth.  The fetal heartbeat will be listened to.  Any test results from the previous visit will be discussed.  You may have a cervical check near your due date to see if your cervix has softened or thinned (effaced).  You will be tested for Group B streptococcus. This happens between 35 and 37 weeks. Your health care provider may ask you:  What your birth plan is.  How you are feeling.  If you are feeling the baby move.  If you have had any abnormal   symptoms, such as leaking fluid, bleeding, severe headaches, or abdominal cramping.  If you are using any tobacco products, including cigarettes, chewing tobacco, and electronic cigarettes.  If you have any questions. Other tests or screenings that may be performed during your third trimester include:  Blood tests that check for low iron levels (anemia).  Fetal testing to check the health, activity level, and growth of the fetus. Testing is done if you have certain medical conditions or if there are problems during the pregnancy.  Nonstress test  (NST). This test checks the health of your baby to make sure there are no signs of problems, such as the baby not getting enough oxygen. During this test, a belt is placed around your belly. The baby is made to move, and its heart rate is monitored during movement. What is false labor? False labor is a condition in which you feel small, irregular tightenings of the muscles in the womb (contractions) that usually go away with rest, changing position, or drinking water. These are called Braxton Hicks contractions. Contractions may last for hours, days, or even weeks before true labor sets in. If contractions come at regular intervals, become more frequent, increase in intensity, or become painful, you should see your health care provider. What are the signs of labor?  Abdominal cramps.  Regular contractions that start at 10 minutes apart and become stronger and more frequent with time.  Contractions that start on the top of the uterus and spread down to the lower abdomen and back.  Increased pelvic pressure and dull back pain.  A watery or bloody mucus discharge that comes from the vagina.  Leaking of amniotic fluid. This is also known as your "water breaking." It could be a slow trickle or a gush. Let your health care provider know if it has a color or strange odor. If you have any of these signs, call your health care provider right away, even if it is before your due date. Follow these instructions at home: Medicines  Follow your health care provider's instructions regarding medicine use. Specific medicines may be either safe or unsafe to take during pregnancy.  Take a prenatal vitamin that contains at least 600 micrograms (mcg) of folic acid.  If you develop constipation, try taking a stool softener if your health care provider approves. Eating and drinking   Eat a balanced diet that includes fresh fruits and vegetables, whole grains, good sources of protein such as meat, eggs, or tofu,  and low-fat dairy. Your health care provider will help you determine the amount of weight gain that is right for you.  Avoid raw meat and uncooked cheese. These carry germs that can cause birth defects in the baby.  If you have low calcium intake from food, talk to your health care provider about whether you should take a daily calcium supplement.  Eat four or five small meals rather than three large meals a day.  Limit foods that are high in fat and processed sugars, such as fried and sweet foods.  To prevent constipation: ? Drink enough fluid to keep your urine clear or pale yellow. ? Eat foods that are high in fiber, such as fresh fruits and vegetables, whole grains, and beans. Activity  Exercise only as directed by your health care provider. Most women can continue their usual exercise routine during pregnancy. Try to exercise for 30 minutes at least 5 days a week. Stop exercising if you experience uterine contractions.  Avoid heavy lifting.  Do   not exercise in extreme heat or humidity, or at high altitudes.  Wear low-heel, comfortable shoes.  Practice good posture.  You may continue to have sex unless your health care provider tells you otherwise. Relieving pain and discomfort  Take frequent breaks and rest with your legs elevated if you have leg cramps or low back pain.  Take warm sitz baths to soothe any pain or discomfort caused by hemorrhoids. Use hemorrhoid cream if your health care provider approves.  Wear a good support bra to prevent discomfort from breast tenderness.  If you develop varicose veins: ? Wear support pantyhose or compression stockings as told by your healthcare provider. ? Elevate your feet for 15 minutes, 3-4 times a day. Prenatal care  Write down your questions. Take them to your prenatal visits.  Keep all your prenatal visits as told by your health care provider. This is important. Safety  Wear your seat belt at all times when driving.  Make  a list of emergency phone numbers, including numbers for family, friends, the hospital, and police and fire departments. General instructions  Avoid cat litter boxes and soil used by cats. These carry germs that can cause birth defects in the baby. If you have a cat, ask someone to clean the litter box for you.  Do not travel far distances unless it is absolutely necessary and only with the approval of your health care provider.  Do not use hot tubs, steam rooms, or saunas.  Do not drink alcohol.  Do not use any products that contain nicotine or tobacco, such as cigarettes and e-cigarettes. If you need help quitting, ask your health care provider.  Do not use any medicinal herbs or unprescribed drugs. These chemicals affect the formation and growth of the baby.  Do not douche or use tampons or scented sanitary pads.  Do not cross your legs for long periods of time.  To prepare for the arrival of your baby: ? Take prenatal classes to understand, practice, and ask questions about labor and delivery. ? Make a trial run to the hospital. ? Visit the hospital and tour the maternity area. ? Arrange for maternity or paternity leave through employers. ? Arrange for family and friends to take care of pets while you are in the hospital. ? Purchase a rear-facing car seat and make sure you know how to install it in your car. ? Pack your hospital bag. ? Prepare the baby's nursery. Make sure to remove all pillows and stuffed animals from the baby's crib to prevent suffocation.  Visit your dentist if you have not gone during your pregnancy. Use a soft toothbrush to brush your teeth and be gentle when you floss. Contact a health care provider if:  You are unsure if you are in labor or if your water has broken.  You become dizzy.  You have mild pelvic cramps, pelvic pressure, or nagging pain in your abdominal area.  You have lower back pain.  You have persistent nausea, vomiting, or diarrhea.   You have an unusual or bad smelling vaginal discharge.  You have pain when you urinate. Get help right away if:  Your water breaks before 37 weeks.  You have regular contractions less than 5 minutes apart before 37 weeks.  You have a fever.  You are leaking fluid from your vagina.  You have spotting or bleeding from your vagina.  You have severe abdominal pain or cramping.  You have rapid weight loss or weight gain.  You have   shortness of breath with chest pain.  You notice sudden or extreme swelling of your face, hands, ankles, feet, or legs.  Your baby makes fewer than 10 movements in 2 hours.  You have severe headaches that do not go away when you take medicine.  You have vision changes. Summary  The third trimester is from week 28 through week 40, months 7 through 9. The third trimester is a time when the unborn baby (fetus) is growing rapidly.  During the third trimester, your discomfort may increase as you and your baby continue to gain weight. You may have abdominal, leg, and back pain, sleeping problems, and an increased need to urinate.  During the third trimester your breasts will keep growing and they will continue to become tender. A yellow fluid (colostrum) may leak from your breasts. This is the first milk you are producing for your baby.  False labor is a condition in which you feel small, irregular tightenings of the muscles in the womb (contractions) that eventually go away. These are called Braxton Hicks contractions. Contractions may last for hours, days, or even weeks before true labor sets in.  Signs of labor can include: abdominal cramps; regular contractions that start at 10 minutes apart and become stronger and more frequent with time; watery or bloody mucus discharge that comes from the vagina; increased pelvic pressure and dull back pain; and leaking of amniotic fluid. This information is not intended to replace advice given to you by your health  care provider. Make sure you discuss any questions you have with your health care provider. Document Released: 05/18/2001 Document Revised: 09/14/2018 Document Reviewed: 06/29/2016 Elsevier Patient Education  2020 [employer]  

## 2019-05-09 NOTE — Progress Notes (Signed)
  Routine Prenatal Care Visit  Subjective  Brandi Solomon is a 28 y.o. G4P1021 at [redacted]w[redacted]d being seen today for ongoing prenatal care.  She is currently monitored for the following issues for this low-risk pregnancy and has Depression affecting pregnancy; Supervision of other normal pregnancy, antepartum; Elevated blood pressure affecting pregnancy, antepartum; Previous cesarean delivery, antepartum condition or complication; Asthma; Obesity affecting pregnancy; and Former smoker on their problem list.  ----------------------------------------------------------------------------------- Patient reports no complaints.   Contractions: Not present. Vag. Bleeding: None.  Movement: Present. Leaking Fluid denies.  ----------------------------------------------------------------------------------- The following portions of the patient's history were reviewed and updated as appropriate: allergies, current medications, past family history, past medical history, past social history, past surgical history and problem list. Problem list updated.  Objective  Blood pressure 122/84, weight 246 lb (111.6 kg), last menstrual period 10/06/2018, unknown if currently breastfeeding. Pregravid weight 243 lb (110.2 kg) Total Weight Gain 3 lb (1.361 kg) Urinalysis: Urine Protein    Urine Glucose    Fetal Status: Fetal Heart Rate (bpm): 132 Fundal Height: 30 cm Movement: Present     General:  Alert, oriented and cooperative. Patient is in no acute distress.  Skin: Skin is warm and dry. No rash noted.   Cardiovascular: Normal heart rate noted  Respiratory: Normal respiratory effort, no problems with respiration noted  Abdomen: Soft, gravid, appropriate for gestational age. Pain/Pressure: Absent     Pelvic:  Cervical exam deferred        Extremities: Normal range of motion.  Edema: None  Mental Status: Normal mood and affect. Normal behavior. Normal judgment and thought content.   Assessment   28 y.o. W4O9735 at  [redacted]w[redacted]d by  07/28/2019, by Ultrasound presenting for routine prenatal visit  Plan   pregnancy #4 Problems (from 10/06/18 to present)    Problem Noted Resolved   Depression affecting pregnancy 01/05/2019 by Homero Fellers, MD No   Supervision of other normal pregnancy, antepartum 01/05/2019 by Homero Fellers, MD No   Overview Addendum 03/14/2019  4:23 PM by Homero Fellers, [place] Prenatal Labs  Dating  12 wk [place] Blood type: A/Positive/-- (08/14 1611)   Genetic Screen NIPS: Negative XX Antibody:Negative (08/14 1611)  Anatomic [place] incomplete Rubella: 2.18 (08/14 1611) Varicella: Immune  GTT Early:        28 wk:      RPR: Non Reactive (08/14 1611)   Rhogam  not needed HBsAg: Negative (08/14 1611)   TDaP vaccine                       HIV: Non Reactive (08/14 1611)   Flu Shot   03/14/2019                             GBS:   Contraception  Pap: 01/05/2019 NILM  CBB     CS/VBAC    Baby Food    Support Person                 Preterm labor symptoms and general obstetric precautions including but not limited to vaginal bleeding, contractions, leaking of fluid and fetal movement were reviewed in detail with the patient. Please refer to After Visit Summary for other counseling recommendations.   Return in about 2 weeks (around 05/23/2019) for rob.  Rod Can, CNM 05/09/2019 9:16 AM

## 2019-05-10 ENCOUNTER — Other Ambulatory Visit: Payer: Self-pay

## 2019-05-10 DIAGNOSIS — O169 Unspecified maternal hypertension, unspecified trimester: Secondary | ICD-10-CM

## 2019-05-10 LAB — 28 WEEK RH+PANEL
Basophils Absolute: 0 10*3/uL (ref 0.0–0.2)
Basos: 0 %
EOS (ABSOLUTE): 0.2 10*3/uL (ref 0.0–0.4)
Eos: 2 %
Gestational Diabetes Screen: 116 mg/dL (ref 65–139)
HIV Screen 4th Generation wRfx: NONREACTIVE
Hematocrit: 33.3 % — ABNORMAL LOW (ref 34.0–46.6)
Hemoglobin: 10.9 g/dL — ABNORMAL LOW (ref 11.1–15.9)
Immature Grans (Abs): 0 10*3/uL (ref 0.0–0.1)
Immature Granulocytes: 0 %
Lymphocytes Absolute: 1.7 10*3/uL (ref 0.7–3.1)
Lymphs: 22 %
MCH: 29.5 pg (ref 26.6–33.0)
MCHC: 32.7 g/dL (ref 31.5–35.7)
MCV: 90 fL (ref 79–97)
Monocytes Absolute: 0.3 10*3/uL (ref 0.1–0.9)
Monocytes: 3 %
Neutrophils Absolute: 5.7 10*3/uL (ref 1.4–7.0)
Neutrophils: 73 %
Platelets: 247 10*3/uL (ref 150–450)
RBC: 3.7 x10E6/uL — ABNORMAL LOW (ref 3.77–5.28)
RDW: 13.1 % (ref 11.7–15.4)
RPR Ser Ql: NONREACTIVE
WBC: 7.9 10*3/uL (ref 3.4–10.8)

## 2019-05-14 ENCOUNTER — Other Ambulatory Visit: Payer: Self-pay

## 2019-05-14 ENCOUNTER — Ambulatory Visit
Admission: RE | Admit: 2019-05-14 | Discharge: 2019-05-14 | Disposition: A | Discharge status: Home / Self Care | Payer: Medicaid Other | Source: Ambulatory Visit | Attending: Maternal & Fetal Medicine | Admitting: Maternal & Fetal Medicine

## 2019-05-14 DIAGNOSIS — O163 Unspecified maternal hypertension, third trimester: Secondary | ICD-10-CM | POA: Diagnosis not present

## 2019-05-14 DIAGNOSIS — Z3A29 29 weeks gestation of pregnancy: Secondary | ICD-10-CM | POA: Insufficient documentation

## 2019-05-14 DIAGNOSIS — Z3480 Encounter for supervision of other normal pregnancy, unspecified trimester: Secondary | ICD-10-CM

## 2019-05-14 DIAGNOSIS — O321XX0 Maternal care for breech presentation, not applicable or unspecified: Secondary | ICD-10-CM | POA: Insufficient documentation

## 2019-05-14 DIAGNOSIS — F32A Depression, unspecified: Secondary | ICD-10-CM

## 2019-05-14 DIAGNOSIS — O169 Unspecified maternal hypertension, unspecified trimester: Secondary | ICD-10-CM

## 2019-05-14 DIAGNOSIS — O99340 Other mental disorders complicating pregnancy, unspecified trimester: Secondary | ICD-10-CM

## 2019-05-16 NOTE — Progress Notes (Signed)
WNL- released to mychart with a note.

## 2019-05-24 ENCOUNTER — Encounter: Payer: Self-pay | Admitting: Obstetrics and Gynecology

## 2019-05-24 ENCOUNTER — Other Ambulatory Visit: Payer: Self-pay

## 2019-05-24 ENCOUNTER — Ambulatory Visit (INDEPENDENT_AMBULATORY_CARE_PROVIDER_SITE_OTHER): Payer: Medicaid Other | Admitting: Obstetrics and Gynecology

## 2019-05-24 VITALS — BP 124/78 | Wt 247.0 lb

## 2019-05-24 DIAGNOSIS — O34219 Maternal care for unspecified type scar from previous cesarean delivery: Secondary | ICD-10-CM

## 2019-05-24 DIAGNOSIS — Z3A30 30 weeks gestation of pregnancy: Secondary | ICD-10-CM

## 2019-05-24 DIAGNOSIS — Z23 Encounter for immunization: Secondary | ICD-10-CM | POA: Diagnosis not present

## 2019-05-24 DIAGNOSIS — Z3480 Encounter for supervision of other normal pregnancy, unspecified trimester: Secondary | ICD-10-CM

## 2019-05-24 DIAGNOSIS — Z98891 History of uterine scar from previous surgery: Secondary | ICD-10-CM

## 2019-05-24 NOTE — Progress Notes (Signed)
ROB No concerns Tdap/Bt consent  Denies lof, no vb, Good FM

## 2019-05-24 NOTE — Patient Instructions (Addendum)
Vaginal Birth After Cesarean Delivery  Vaginal birth after cesarean delivery (VBAC) is giving birth vaginally after previously delivering a baby through a cesarean section (C-section). A VBAC may be a safe option for you, depending on your health and other factors. It is important to discuss VBAC with your health care provider early in your pregnancy so you can understand the risks, benefits, and options. Having these discussions early will give you time to make your birth plan. Who are the best candidates for VBAC? The best candidates for VBAC are women who:  Have had one or two prior cesarean deliveries, and the incision made during the delivery was horizontal (low transverse).  Do not have a vertical (classical) scar on their uterus.  Have not had a tear in the wall of their uterus (uterine rupture).  Plan to have more pregnancies. A VBAC is also more likely to be successful:  In women who have previously given birth vaginally.  When labor starts by itself (spontaneously) before the due date. What are the benefits of VBAC? The benefits of delivering your baby vaginally instead of by a cesarean delivery include:  A shorter hospital stay.  A faster recovery time.  Less pain.  Avoiding risks associated with major surgery, such as infection and blood clots.  Less blood loss and less need for donated blood (transfusions). What are the risks of VBAC? The main risk of attempting a VBAC is that it may fail, forcing your health care provider to deliver your baby by a C-section. Other risks are rare and include:  Tearing (rupture) of the scar from a past cesarean delivery.  Other risks associated with vaginal deliveries. If a repeat cesarean delivery is needed, the risks include:  Blood loss.  Infection.  Blood clot.  Damage to surrounding organs.  Removal of the uterus (hysterectomy), if it is damaged.  Placenta problems in future pregnancies. What else should I know  about my options? Delivering a baby through a VBAC is similar to having a normal spontaneous vaginal delivery. Therefore, it is safe:  To try with twins.  For your health care provider to try to turn the baby from a breech position (external cephalic version) during labor.  With epidural analgesia for pain relief. Consider where you would like to deliver your baby. VBAC should be attempted in facilities where an emergency cesarean delivery can be performed. VBAC is not recommended for home births. Any changes in your health or your baby's health during your pregnancy may make it necessary to change your initial decision about VBAC. Your health care provider may recommend that you do not attempt a VBAC if:  Your baby's suspected weight is 8.8 lb (4 kg) or more.  You have preeclampsia. This is a condition that causes high blood pressure along with other symptoms, such as swelling and headaches.  You will have VBAC less than 19 months after your cesarean delivery.  You are past your due date.  You need to have labor started (induced) because your cervix is not ready for labor (unfavorable). Where to find more information  American Pregnancy Association: americanpregnancy.org  [employer] of Obstetricians and Gynecologists: acog.org Summary  Vaginal birth after cesarean delivery (VBAC) is giving birth vaginally after previously delivering a baby through a cesarean section (C-section). A VBAC may be a safe option for you, depending on your health and other factors.  Discuss VBAC with your health care provider early in your pregnancy so you can understand the risks, benefits, options, and  have plenty of time to make your birth plan.  The main risk of attempting a VBAC is that it may fail, forcing your health care provider to deliver your baby by a C-section. Other risks are rare. This information is not intended to replace advice given to you by your health care provider. Make sure  you discuss any questions you have with your health care provider. Document Released: 11/14/2006 Document Revised: 09/19/2018 Document Reviewed: 08/31/2016 Elsevier Patient Education  [employer]2020 Else[employer].  28 y.o. [id number] at 342w5d with Estimated Date of Delivery: 07/28/19 was seen today in office to discuss trial of labor after cesarean section (TOLAC) versus elective repeat cesarean delivery (ERCD). The following risks were discussed with the patient.  Risk of uterine rupture at term is 0.78 percent with TOLAC and 0.22 percent with ERCD. 1 in 10 uterine ruptures will result in neonatal death or neurological injury. The benefits of a trial of labor after cesarean (TOLAC) resulting in a vaginal birth after cesarean (VBAC) include the following: shorter length of hospital stay and postpartum recovery (in most cases); fewer complications, such as postpartum fever, wound or uterine infection, thromboembolism (blood clots in the leg or lung), need for blood transfusion and fewer neonatal breathing problems. The risks of an attempted VBAC or TOLAC include the following: . Risk of failed trial of labor after cesarean (TOLAC) without a vaginal birth after cesarean (VBAC) resulting in repeat cesarean delivery (RCD) in about 20 to 40 percent of women who attempt VBAC.  Her individualized success rate using the MFMU VBAC risk calculator is :    Predicted chance of vaginal birth after cesarean:  58.3%   95% confidence interval:  [54.0%, 62.5%]  . Risk of rupture of uterus resulting in an emergency cesarean delivery. The risk of uterine rupture may be related in part to the type of uterine incision made during the first cesarean delivery. A previous transverse uterine incision has the lowest risk of rupture (0.2 to 1.5 percent risk). Vertical or T-shaped uterine incisions have a higher risk of uterine rupture (4 to 9 percent risk)The risk of fetal death is very low with both VBAC and elective repeat cesarean  delivery (ERCD), but the likelihood of fetal death is higher with VBAC than with ERCD. Maternal death is very rare with either type of delivery. The risks of an elective repeat cesarean delivery (ERCD) were reviewed with the patient including but not limited to: 07/998 risk of uterine rupture which could have serious consequences, bleeding which may require transfusion; infection which may require antibiotics; injury to bowel, bladder or other surrounding organs (bowel, bladder, ureters); injury to the fetus; need for additional procedures including hysterectomy in the event of a life-threatening hemorrhage; thromboembolic phenomenon; abnormal placentation; incisional problems; death and other postoperative or anesthesia complications.    In addition we discussed that our collective office practice is to allow patient's who desire to attempt TOLAC to go into labor naturally.  There is some limited data that rupture rate may increase past [redacted] weeks gestation, but it is reasonable for women who are strongly [facility] to continue pregnancy into the 41st week.  Medical indications necessetating early delivery may arise during the course of any pregnancy.  Given the contraindication on the use of prostaglandins for use in cervical ripening,  recommendation would be to proceed with repeat cesarean for delivery for patient's with unfavorable cervix (low Bishops score) who reach 41 weeks or who otherwise have a medical indication for early delivery.  These risks and benefits are summarized on the consent form, which was reviewed with the patient during the visit.  All her questions answered and she signed a consent indicating a preference for TOLAC/ERCD. A copy of the consent was given to the patient.      Third Trimester of Pregnancy  The third trimester is from week 28 through week 40 (months 7 through 9). This trimester is when your unborn baby (fetus) is growing very fast. At the end of the ninth  month, the unborn baby is about 20 inches in length. It weighs about 6-10 pounds. Follow these instructions at home: Medicines  Take over-the-counter and prescription medicines only as told by your doctor. Some medicines are safe and some medicines are not safe during pregnancy.  Take a prenatal vitamin that contains at least 600 micrograms (mcg) of folic acid.  If you have trouble pooping (constipation), take medicine that will make your stool soft (stool softener) if your doctor approves. Eating and drinking   Eat regular, healthy meals.  Avoid raw meat and uncooked cheese.  If you get low calcium from the food you eat, talk to your doctor about taking a daily calcium supplement.  Eat four or five small meals rather than three large meals a day.  Avoid foods that are high in fat and sugars, such as fried and sweet foods.  To prevent constipation: ? Eat foods that are high in fiber, like fresh fruits and vegetables, whole grains, and beans. ? Drink enough fluids to keep your pee (urine) clear or pale yellow. Activity  Exercise only as told by your doctor. Stop exercising if you start to have cramps.  Avoid heavy lifting, wear low heels, and sit up straight.  Do not exercise if it is too hot, too humid, or if you are in a place of great height (high altitude).  You may continue to have sex unless your doctor tells you not to. Relieving pain and discomfort  Wear a good support bra if your breasts are tender.  Take frequent breaks and rest with your legs raised if you have leg cramps or low back pain.  Take warm water baths (sitz baths) to soothe pain or discomfort caused by hemorrhoids. Use hemorrhoid cream if your doctor approves.  If you develop puffy, bulging veins (varicose veins) in your legs: ? Wear support hose or compression stockings as told by your doctor. ? Raise (elevate) your feet for 15 minutes, 3-4 times a day. ? Limit salt in your food. Safety  Wear your  seat belt when driving.  Make a list of emergency phone numbers, including numbers for family, friends, the hospital, and police and fire departments. Preparing for your baby's arrival To prepare for the arrival of your baby:  Take prenatal classes.  Practice driving to the hospital.  Visit the hospital and tour the maternity area.  Talk to your work about taking leave once the baby comes.  Pack your hospital bag.  Prepare the baby's room.  Go to your doctor visits.  Buy a rear-facing car seat. Learn how to install it in your car. General instructions  Do not use hot tubs, steam rooms, or saunas.  Do not use any products that contain nicotine or tobacco, such as cigarettes and e-cigarettes. If you need help quitting, ask your doctor.  Do not drink alcohol.  Do not douche or use tampons or scented sanitary pads.  Do not cross your legs for long periods of time.  Do  not travel for long distances unless you must. Only do so if your doctor says it is okay.  Visit your dentist if you have not gone during your pregnancy. Use a soft toothbrush to brush your teeth. Be gentle when you floss.  Avoid cat litter boxes and soil used by cats. These carry germs that can cause birth defects in the baby and can cause a loss of your baby (miscarriage) or stillbirth.  Keep all your prenatal visits as told by your doctor. This is important. Contact a doctor if:  You are not sure if you are in labor or if your water has broken.  You are dizzy.  You have mild cramps or pressure in your lower belly.  You have a nagging pain in your belly area.  You continue to feel sick to your stomach, you throw up, or you have watery poop.  You have bad smelling fluid coming from your vagina.  You have pain when you pee. Get help right away if:  You have a fever.  You are leaking fluid from your vagina.  You are spotting or bleeding from your vagina.  You have severe belly cramps or  pain.  You lose or gain weight quickly.  You have trouble catching your breath and have chest pain.  You notice sudden or extreme puffiness (swelling) of your face, hands, ankles, feet, or legs.  You have not felt the baby move in over an hour.  You have severe headaches that do not go away with medicine.  You have trouble seeing.  You are leaking, or you are having a gush of fluid, from your vagina before you are 37 weeks.  You have regular belly spasms (contractions) before you are 37 weeks. Summary  The third trimester is from week 28 through week 40 (months 7 through 9). This time is when your unborn baby is growing very fast.  Follow your doctor's advice about medicine, food, and activity.  Get ready for the arrival of your baby by taking prenatal classes, getting all the baby items ready, preparing the baby's room, and visiting your doctor to be checked.  Get help right away if you are bleeding from your vagina, or you have chest pain and trouble catching your breath, or if you have not felt your baby move in over an hour. This information is not intended to replace advice given to you by your health care provider. Make sure you discuss any questions you have with your health care provider. Document Released: 08/18/2009 Document Revised: 09/14/2018 Document Reviewed: 06/29/2016 Elsevier Patient Education  2020 [employer].

## 2019-05-24 NOTE — Progress Notes (Signed)
Routine Prenatal Care Visit  Subjective  Brandi Solomon is a 28 y.o. G4P1021 at [redacted]w[redacted]d being seen today for ongoing prenatal care.  She is currently monitored for the following issues for this low-risk pregnancy and has Depression affecting pregnancy; Supervision of other normal pregnancy, antepartum; Elevated blood pressure affecting pregnancy, antepartum; Previous cesarean delivery, antepartum condition or complication; Asthma; Obesity affecting pregnancy; and Former smoker on their problem list.  ----------------------------------------------------------------------------------- Patient reports no complaints.   Contractions: Not present. Vag. Bleeding: None.  Movement: Present. Denies leaking of fluid.  ----------------------------------------------------------------------------------- The following portions of the patient's history were reviewed and updated as appropriate: allergies, current medications, past family history, past medical history, past social history, past surgical history and problem list. Problem list updated.   Objective  Blood pressure 124/78, weight 247 lb (112 kg), last menstrual period 10/06/2018, unknown if currently breastfeeding. Pregravid weight 243 lb (110.2 kg) Total Weight Gain 4 lb (1.814 kg) Urinalysis:      Fetal Status: Fetal Heart Rate (bpm): 176 Fundal Height: 34 cm Movement: Present     General:  Alert, oriented and cooperative. Patient is in no acute distress.  Skin: Skin is warm and dry. No rash noted.   Cardiovascular: Normal heart rate noted  Respiratory: Normal respiratory effort, no problems with respiration noted  Abdomen: Soft, gravid, appropriate for gestational age. Pain/Pressure: Absent     Pelvic:  Cervical exam deferred        Extremities: Normal range of motion.  Edema: None  Mental Status: Normal mood and affect. Normal behavior. Normal judgment and thought content.     Assessment   28 y.o. [id number] at [redacted]w[redacted]d by  07/28/2019,  by Ultrasound presenting for routine prenatal visit  Plan   pregnancy #4 Problems (from 10/06/18 to present)    Problem Noted Resolved   Depression affecting pregnancy 01/05/2019 by Natale Milch, MD No   Supervision of other normal pregnancy, antepartum 01/05/2019 by Natale Milch, MD No   Overview Addendum 05/24/2019  8:37 AM by Natale Milch, MD      Clinic [facility] Prenatal Labs  Dating  12 wk [place] Blood type: A/Positive/-- (08/14 1611)   Genetic Screen NIPS: Negative XX Antibody:Negative (08/14 1611)  Anatomic [place] incomplete Rubella: 2.18 (08/14 1611) Varicella: Immune  GTT  28 wk:    116  RPR: Non Reactive (08/14 1611)   Rhogam  not needed HBsAg: Negative (08/14 1611)   TDaP vaccine  05/24/2019                     HIV: Non Reactive (08/14 1611)   Flu Shot   03/14/2019                             GBS:   Contraception  undecided, considering Depo vs Nexplanon/IUD Pap: 01/05/2019 NILM  CBB     CS/VBAC 2010/breech- Desires TOLAC, counseled on risks   Baby Food  Breast- RSB discussed   Support Person                 Gestational age appropriate obstetric precautions including but not limited to vaginal bleeding, contractions, leaking of fluid and fetal movement were reviewed in detail with the patient.    TOLAC counseling with individual risk assessment provided- see AVS.  Baby is currently Breech, consider version versus repeat cesarean Discussed starting PO iron supplement for anemia. Discussed stool softner, colace PRN Discussed breastfeeding  and option for birth control postpartum Briefly discussed birth control options for postpartum. TDAP vaccine today  Return in about 2 weeks (around 06/07/2019) for ROB in person.  Homero Fellers MD [facility], [facility] 05/24/2019, 8:37 AM

## 2019-06-06 ENCOUNTER — Encounter: Payer: Medicaid Other | Admitting: Obstetrics and Gynecology

## 2019-06-21 ENCOUNTER — Encounter: Payer: Self-pay | Admitting: Obstetrics and Gynecology

## 2019-06-21 ENCOUNTER — Other Ambulatory Visit: Payer: Self-pay

## 2019-06-21 ENCOUNTER — Ambulatory Visit (INDEPENDENT_AMBULATORY_CARE_PROVIDER_SITE_OTHER): Payer: Medicaid Other | Admitting: Obstetrics and Gynecology

## 2019-06-21 VITALS — BP 118/72 | Wt 247.0 lb

## 2019-06-21 DIAGNOSIS — Z113 Encounter for screening for infections with a predominantly sexual mode of transmission: Secondary | ICD-10-CM

## 2019-06-21 DIAGNOSIS — O99213 Obesity complicating pregnancy, third trimester: Secondary | ICD-10-CM

## 2019-06-21 DIAGNOSIS — Z3480 Encounter for supervision of other normal pregnancy, unspecified trimester: Secondary | ICD-10-CM

## 2019-06-21 DIAGNOSIS — Z3A34 34 weeks gestation of pregnancy: Secondary | ICD-10-CM

## 2019-06-21 DIAGNOSIS — O34219 Maternal care for unspecified type scar from previous cesarean delivery: Secondary | ICD-10-CM

## 2019-06-21 DIAGNOSIS — Z98891 History of uterine scar from previous surgery: Secondary | ICD-10-CM

## 2019-06-21 NOTE — Progress Notes (Signed)
Routine Prenatal Care Visit  Subjective  Brandi Solomon is a 29 y.o. G4P1021 at [redacted]w[redacted]d being seen today for ongoing prenatal care.  She is currently monitored for the following issues for this low-risk pregnancy and has Depression affecting pregnancy; Supervision of other normal pregnancy, antepartum; Elevated blood pressure affecting pregnancy, antepartum; Previous cesarean delivery, antepartum condition or complication; Asthma; Obesity affecting pregnancy; and Former smoker on their problem list.  ----------------------------------------------------------------------------------- Patient reports a week or two ago after shaving had a painful lesion on her labia.   Contractions: Not present. Vag. Bleeding: None.  Movement: Present. Denies leaking of fluid.  ----------------------------------------------------------------------------------- The following portions of the patient's history were reviewed and updated as appropriate: allergies, current medications, past family history, past medical history, past social history, past surgical history and problem list. Problem list updated.   Objective  Blood pressure 118/72, weight 247 lb (112 kg), last menstrual period 10/06/2018, unknown if currently breastfeeding. Pregravid weight 243 lb (110.2 kg) Total Weight Gain 4 lb (1.814 kg) Urinalysis:      Fetal Status: Fetal Heart Rate (bpm): 132 Fundal Height: 36 cm Movement: Present     General:  Alert, oriented and cooperative. Patient is in no acute distress.  Skin: Skin is warm and dry. No rash noted.   Cardiovascular: Normal heart rate noted  Respiratory: Normal respiratory effort, no problems with respiration noted  Abdomen: Soft, gravid, appropriate for gestational age. Pain/Pressure: Present     Pelvic:  Cervical exam deferred        Extremities: Normal range of motion.  Edema: None  Mental Status: Normal mood and affect. Normal behavior. Normal judgment and thought content.      Assessment   29 y.o. [id number] at [redacted]w[redacted]d by  07/28/2019, by Ultrasound presenting for routine prenatal visit  Plan   pregnancy #4 Problems (from 10/06/18 to present)    Problem Noted Resolved   Previous cesarean delivery, antepartum condition or complication 82/95/6213 by Gatha Mayer, MD No   Overview Addendum 05/24/2019  8:40 AM by Homero Fellers, MD    Done for breech Pt desires TOLAC   MFMU calculator 66% success   [email or web address] Predicted chance of vaginal birth after cesarean:  58.3% 95% confidence interval:  [54.0%, 62.5%]      Depression affecting pregnancy 01/05/2019 by Homero Fellers, MD No   Supervision of other normal pregnancy, antepartum 01/05/2019 by Homero Fellers, MD No   Overview Addendum 05/24/2019  8:37 AM by Homero Fellers, MD      Clinic [facility] Prenatal Labs  Dating  12 wk [place] Blood type: A/Positive/-- (08/14 1611)   Genetic Screen NIPS: Negative XX Antibody:Negative (08/14 1611)  Anatomic [place] incomplete Rubella: 2.18 (08/14 1611) Varicella: Immune  GTT  28 wk:    116  RPR: Non Reactive (08/14 1611)   Rhogam  not needed HBsAg: Negative (08/14 1611)   TDaP vaccine  05/24/2019                     HIV: Non Reactive (08/14 1611)   Flu Shot   03/14/2019                             GBS:   Contraception  undecided, considering Depo vs Nexplanon/IUD Pap: 01/05/2019 NILM  CBB     CS/VBAC 2010/breech- Desires TOLAC, counseled on risks   Baby Food  Breast- RSB discussed   Support Person  Gestational age appropriate obstetric precautions including but not limited to vaginal bleeding, contractions, leaking of fluid and fetal movement were reviewed in detail with the patient.    Would like a cesarean if still breech instead of ECV Will test for HSV. Would need treatment at 36 weeks if positive serology.   Return in about 2 weeks (around 07/05/2019) for  ROB in person and [place] withschuman please.  Natale Milch MD [facility], [facility] 06/21/2019, 12:06 PM

## 2019-06-21 NOTE — Progress Notes (Signed)
ROB C/o pelvic pressure Denies lof, no vb Good FM

## 2019-06-23 LAB — HSV(HERPES SMPLX)ABS-I+II(IGG+IGM)-BLD
HSV 1 Glycoprotein G Ab, IgG: 46.1 index — ABNORMAL HIGH (ref 0.00–0.90)
HSV 2 IgG, Type Spec: <0.91 index (ref 0.00–0.90)
HSVI/II Comb IgM: <0.91 Ratio (ref 0.00–0.90)

## 2019-06-27 ENCOUNTER — Encounter: Payer: Self-pay | Admitting: Obstetrics and Gynecology

## 2019-06-27 ENCOUNTER — Other Ambulatory Visit: Payer: Self-pay

## 2019-06-27 ENCOUNTER — Other Ambulatory Visit: Payer: Self-pay | Admitting: Obstetrics and Gynecology

## 2019-06-27 ENCOUNTER — Observation Stay
Admission: EM | Admit: 2019-06-27 | Discharge: 2019-06-27 | Disposition: A | Discharge status: Home / Self Care | Payer: Medicaid Other | Attending: Certified Nurse Midwife | Admitting: Certified Nurse Midwife

## 2019-06-27 ENCOUNTER — Telehealth: Payer: Self-pay

## 2019-06-27 DIAGNOSIS — Z3A35 35 weeks gestation of pregnancy: Secondary | ICD-10-CM | POA: Insufficient documentation

## 2019-06-27 DIAGNOSIS — R262 Difficulty in walking, not elsewhere classified: Secondary | ICD-10-CM | POA: Insufficient documentation

## 2019-06-27 DIAGNOSIS — Z87891 Personal history of nicotine dependence: Secondary | ICD-10-CM | POA: Insufficient documentation

## 2019-06-27 DIAGNOSIS — O26893 Other specified pregnancy related conditions, third trimester: Principal | ICD-10-CM | POA: Insufficient documentation

## 2019-06-27 DIAGNOSIS — M549 Dorsalgia, unspecified: Secondary | ICD-10-CM | POA: Diagnosis present

## 2019-06-27 DIAGNOSIS — M5442 Lumbago with sciatica, left side: Secondary | ICD-10-CM | POA: Diagnosis not present

## 2019-06-27 DIAGNOSIS — O99891 Other specified diseases and conditions complicating pregnancy: Secondary | ICD-10-CM

## 2019-06-27 HISTORY — DX: Dorsalgia, unspecified: M54.9

## 2019-06-27 HISTORY — DX: Other mental disorders complicating pregnancy, unspecified trimester: O99.340

## 2019-06-27 HISTORY — DX: Other specified diseases and conditions complicating pregnancy: O99.891

## 2019-06-27 HISTORY — DX: Obesity, unspecified: E66.9

## 2019-06-27 HISTORY — DX: Depression, unspecified: F32.A

## 2019-06-27 MED ORDER — ACETAMINOPHEN 500 MG PO TABS: 1000.0000 mg | ORAL_TABLET | Freq: Four times a day (QID) | ORAL | 0 refills | Status: DC | PRN

## 2019-06-27 MED ORDER — ACETAMINOPHEN 500 MG PO TABS
1000.0000 mg | ORAL_TABLET | Freq: Four times a day (QID) | ORAL | Status: DC | PRN
  Administered 2019-06-27: 1000 mg via ORAL
  Filled 2019-06-27: qty 2

## 2019-06-27 NOTE — Evaluation (Signed)
Physical Therapy Evaluation Patient Details Name: Brandi Solomon MRN: [id number] DOB: Jun 22, 1990 Today's Date: 06/27/2019   History of Present Illness  Pt is a 29 y.o. female ([redacted] weeks pregnant with 2nd child) presenting to hospital with L back pain, L hip pain, and pain down L leg; pt with recent onset of symptoms and significant difficulty with mobility.  PT consult for back pain/sciatica in pregnancy; difficulty ambulating.  Clinical Impression  Prior to hospital admission and recent onset of symptoms, pt was independent; lives with her significant other.  Pt reporting mild pain (3/10) at rest in L low back but pain increased significantly with mobility (sharp L LBP with squeezing type pain in low back).  L LE noted to be longer than R LE in bed.  In standing pt noted to have significant pain in L quadratus lumborum muscle to palpation and L pelvis appearing to be rotated anteriorly.  Performed exercise for L quadratus lumborum (to relax muscle--decreased pain noted to palpation to this muscle after exercise activity).  Also performed muscle activation exercise to attempt to correct L anterior pelvic rotation.  SI belt utilized (donned in supine) and pt educated on use, donning/doffing, safety, and wear instructions.  Significant improvement in functional mobility and pain noted (and pt reporting significant improvement In L LBP symptoms) with above treatment strategies (exercises/treatment and SI belt use) and pt able to progress to transfers with CGA (using walker) and then ambulating 80 feet with RW CGA.  Pt would benefit from skilled PT to address noted impairments and functional limitations (see below for any additional details).  Upon hospital discharge, pt would benefit from OP PT.  During 1st part of session, physical therapist Letitia Libra present who performed assessment and treatment of pt's symptoms.  Therapist updated pt's nurse and Dalia Heading, CNM regarding PT findings, treatment,  and recommendations once therapy session was finished.    Follow Up Recommendations Outpatient PT;Supervision for mobility/OOB(assist for stairs)    Equipment Recommendations  Rolling walker with 5" wheels;3in1 (PT);Other (comment)(SI belt)    Recommendations for Other Services       Precautions / Restrictions Precautions Precautions: Fall Restrictions Weight Bearing Restrictions: No      Mobility  Bed Mobility Overal bed mobility: Needs Assistance Bed Mobility: Supine to Sit;Sit to Supine     Supine to sit: Mod assist;Supervision;HOB elevated Sit to supine: Min assist;HOB elevated   General bed mobility comments: 1st trial pt requiring assist for L LE and trunk semi-supine to sitting edge of bed but improved to SBA 2nd trial after treatment for LBP and SI belt use; sit to supine min assist for L LE  Transfers Overall transfer level: Needs assistance Equipment used: Rolling walker (2 wheeled) Transfers: Sit to/from Stand Sit to Stand: Min guard         General transfer comment: 1st trial standing pt requiring min assist and extra effort to stand up to walker; 2nd and 3rd trial standing (after treatment for LBP and SI belt use; also vc's for UE placement/walker use) improved to CGA (improved quality of transfer noted 3rd trial standing)  Ambulation/Gait Ambulation/Gait assistance: Min guard Gait Distance (Feet): 80 Feet(in room) Assistive device: Rolling walker (2 wheeled)   Gait velocity: decreased   General Gait Details: decreased B LE step length; mild decreased WB'ing/stance time L LE; steady with walker use and multiple turns in room  Stairs Stairs: (discussed/educated pt and pt's significant other regarding sidestepping technique and UE support and assist levels anticipated to navigate  stairs (pt's significant other reported he felt comfortable providing needed assist to navigate stairs))          Wheelchair Mobility    Modified Rankin (Stroke Patients  Only)       Balance Overall balance assessment: Needs assistance Sitting-balance support: No upper extremity supported;Feet supported Sitting balance-Leahy Scale: Normal Sitting balance - Comments: steady sitting reaching outside BOS   Standing balance support: Single extremity supported Standing balance-Leahy Scale: Fair Standing balance comment: pt requiring at least single UE support for static standing balance (d/t LBP)                             Pertinent Vitals/Pain Pain Assessment: 0-10 Pain Score: 3 (3/10 at rest but significant increase in pain with mobility) Pain Location: low back L side Pain Descriptors / Indicators: Squeezing;Sharp;Shooting;Tightness Pain Intervention(s): Limited activity within patient's tolerance;Monitored during session;Repositioned;Relaxation    Home Living Family/patient expects to be discharged to:: Private residence Living Arrangements: Spouse/significant other Available Help at Discharge: Family Type of Home: House Home Access: Stairs to enter Entrance Stairs-Rails: None Entrance Stairs-Number of Steps: 3 plus 1 step to enter Home Layout: One level Home Equipment: None Additional Comments: Family may have DME    Prior Function Level of Independence: Independent               Hand Dominance        Extremity/Trunk Assessment   Upper Extremity Assessment Upper Extremity Assessment: Overall WFL for tasks assessed    Lower Extremity Assessment Lower Extremity Assessment: LLE deficits/detail LLE Deficits / Details: L LE leg length longer than R LE laying in bed; difficulty with L LE active movement d/t LBP LLE: Unable to fully assess due to pain    Cervical / Trunk Assessment Cervical / Trunk Assessment: Normal  Communication   Communication: No difficulties  Cognition Arousal/Alertness: Awake/alert Behavior During Therapy: WFL for tasks assessed/performed Overall Cognitive Status: Within Functional Limits  for tasks assessed                                        General Comments   Nursing cleared pt for participation in physical therapy.  Pt agreeable to PT session.  Pt's significant other present during session.  Therapist discussed pt's status with pt's nurse and Farrel Conners CNM via phone prior to initiating therapy session.  Discussed use of SI belt with Farrel Conners CNM post session who was agreeable to use of SI belt for pt's care (to be used PRN).    Exercises  Exercise: manual technique to promote relaxation of L quadratus lumborum muscle (pt in R sidelying; gradual increase in manual pressure to L quadratus lumborum with focus on breathing) Exercise: L heel digs (pt in hooklying laying in bed) to promote muscle activation to reduce L anterior pelvic rotation (5 reps x2 trials) Exercise:  Mini bridging (pt in hooklying) x5 reps (minimal lift of hips noted)   Assessment/Plan    PT Assessment Patient needs continued PT services  PT Problem List Decreased strength;Decreased activity tolerance;Decreased balance;Decreased mobility;Decreased knowledge of use of DME;Pain       PT Treatment Interventions DME instruction;Gait training;Stair training;Functional mobility training;Therapeutic activities;Therapeutic exercise;Balance training;Neuromuscular re-education;Patient/family education    PT Goals (Current goals can be found in the Care Plan section)  Acute Rehab PT Goals Patient Stated Goal: to improve  pain and mobility PT Goal Formulation: With patient Time For Goal Achievement: 07/11/19 Potential to Achieve Goals: Good    Frequency Min 2X/week   Barriers to discharge        Co-evaluation               AM-PAC PT "6 Clicks" Mobility  Outcome Measure Help needed turning from your back to your side while in a flat bed without using bedrails?: A Little Help needed moving from lying on your back to sitting on the side of a flat bed without using  bedrails?: A Little Help needed moving to and from a bed to a chair (including a wheelchair)?: A Little Help needed standing up from a chair using your arms (e.g., wheelchair or bedside chair)?: A Little Help needed to walk in hospital room?: A Little Help needed climbing 3-5 steps with a railing? : A Little 6 Click Score: 18    End of Session Equipment Utilized During Treatment: Other (comment)(SI belt) Activity Tolerance: Patient tolerated treatment well Patient left: with call bell/phone within reach(sitting on edge of bed with significant other present) Nurse Communication: Mobility status;Precautions;Other (comment)(PT recommendations) PT Visit Diagnosis: Other abnormalities of gait and mobility (R26.89);Difficulty in walking, not elsewhere classified (R26.2);Pain Pain - Right/Left: Left Pain - part of body: (low back/hip)    Time: 1405-1450 PT Time Calculation (min) (ACUTE ONLY): 45 min   Charges:   PT Evaluation $PT Eval Low Complexity: 1 Low PT Treatments $Gait Training: 8-22 mins $Therapeutic Exercise: 8-22 mins       Hendricks Limes, PT 06/27/19, 5:33 PM

## 2019-06-27 NOTE — Telephone Encounter (Signed)
Pt calling; tx'd from SP c/o really bad back pain; can hardly move; no tightening; should she go to hosp in [place] or here.  Adv here.  Misty notified.  Pt aware to enter hosp via ED

## 2019-06-27 NOTE — OB Triage Note (Signed)
Pt presents to L&D with c/o back pain since yestrday at 3pm and worsening pain especially in left hip today. Pt can barely stand and has difficultly getting to bed and yelled out in pain upon sitting. States she is fine if she is not moving. Pt reports good fetal movement, denies contractions, LOF or vaginal bleeding

## 2019-06-27 NOTE — Final Progress Note (Signed)
Physician Final Progress Note  Patient ID: Brandi Solomon MRN: [id number] DOB/AGE: [birth year] 29 y.o.  Admit date: 06/27/2019 Admitting provider: Natale Milch, MD Jill Side L. Sharen Hones, CNM  Discharge date: 06/27/2019   Admission Diagnoses: Back pain affecting pregnancy in the third trimester  Discharge Diagnoses: Left lower back pain with sciatica  Consults: rehabilitation medicine (PT)  Significant Findings/ Diagnostic Studies:  OB History & Physical   History of Present Illness:  Chief Complaint:   HPI:  Brandi Solomon is a 29 y.o. G13P1021 female with EDC=07/28/2019 at [redacted]w[redacted]d dated by a 12wk6d ultrasound.  Her pregnancy has been complicated by asthma, obesity,  a history of a Cesarean section with her first delivery, and depression..  She presents to L&D for evaluation of left sided lower back pain radiating down her left leg when she stands. Acute onset of pain yesterday at 3PM. Denies fall or trauma. The pain has worsened today and she has difficulty standing and walking. Pain is minimal if she lies and does not move. Heat and a warm shower decreased pain last night. Thinks baby turned from breech presentation to cephalic presentation. .  Denies vaginal bleeding, dysuria, hematuria, or contractions. Baby active. Has urge to urinate. No incontinence.  Prenatal care site: [facility]atal care at [facility] has also  been remarkable for   [facility] Prenatal Labs  Dating  12 wk [place] Blood type: A/Positive/-- (08/14 1611)   Genetic Screen NIPS: Negative XX Antibody:Negative (08/14 1611)  Anatomic [place] incomplete Rubella: 2.18 (08/14 1611) Varicella: Immune  GTT  28 wk:    116  RPR: Non Reactive (08/14 1611)   Rhogam  not needed HBsAg: Negative (08/14 1611)   TDaP vaccine  05/24/2019                     HIV: Non Reactive (08/14 1611)   Flu Shot   03/14/2019                             GBS:   Contraception  undecided, considering Depo vs Nexplanon/IUD Pap: 01/05/2019  NILM  CBB     CS/VBAC 2010/breech- Desires TOLAC, counseled on risks   Baby Food  Breast- RSB discussed   Support Person           Maternal Medical History:   Past Medical History:  Diagnosis Date  . Asthma   . Depression affecting pregnancy   . Obesity (BMI 30-39.9)     Past Surgical History:  Procedure Laterality Date  . CESAREAN SECTION      No Known Allergies  Prior to Admission medications   Medication Sig Start Date End Date Taking? Authorizing Provider  acetaminophen (TYLENOL) 500 MG tablet Take 2 tablets (1,000 mg total) by mouth every 6 (six) hours as needed for fever or headache. 06/27/19   Farrel Conners, CNM  Prenatal Vit-Fe Fumarate-FA (MULTIVITAMIN-PRENATAL) 27-0.8 MG TABS tablet Take 1 tablet by mouth daily at 12 noon.    [provider]      Social History: She  reports that she has quit smoking. She has never used smokeless tobacco. She reports previous alcohol use. She reports that she does not use drugs.  Family History: family history includes Diabetes Mellitus II in her maternal aunt; Hypertension in her maternal aunt; Skin cancer in her maternal aunt; Throat cancer in her maternal aunt.   Review of Systems: Negative x 10 systems reviewed except as noted in  the HPI.      Physical Exam:  Vital Signs: BP 130/77   Pulse (!) 101   Temp 98.1 F (36.7 C) (Oral)   Resp 20   Ht 5\' 11"  (1.803 m)   Wt 112 kg   LMP 10/06/2018 (Exact Date)   BMI 34.44 kg/m  General: gravid WF in pain as she laid down in bed, fidgeting in bed trying to get comfortable.  HEENT: normocephalic, atraumatic Lungs: normal respiratory effort Abdomen: soft, gravid, non-tender;  Back: tenderness in left SI area and sacrum. There appears to be edema over the sacrum. Extremities: non-tender, symmetric, no edema bilaterally.can move both legs while lying, but moving the left leg causes pain in the left SI area. No CVAT Neurologic: Alert & oriented x 3.   Baseline  FHR: 140s baseline with accelerations to 170s, moderate variability Toco: no contractions seen  Bedside Ultrasound:  Cephalic presentation (LOT) Assessment:  Brandi Solomon is a 29 y.o. [phone] female at [redacted]w[redacted]d with left sided lower back pain and sciatica   Plan: Consulted PT who evaluated her and recommended a SI belt and helped her get up and ambulate. PT recommends bedside commode and wheeled walker. Patient states her mother can get this equipment. Will continue PT as an outpatient. Also recommended ice or heat, BIofreeze, Tylenol for pain Reschedule tomorrow's ROB appointment to Friday.    Procedures: none  Discharge Condition: stable  Disposition: Discharge disposition: 01-Home or Self Care       Diet: Regular diet  Discharge Activity: Activity as tolerated  Discharge Instructions    Discharge patient   Complete by: As directed    Discharge disposition: 01-Home or Self Care   Discharge patient date: 06/27/2019     Allergies as of 06/27/2019   No Known Allergies     Medication List    TAKE these medications   acetaminophen 500 MG tablet Commonly known as: TYLENOL Take 2 tablets (1,000 mg total) by mouth every 6 (six) hours as needed for fever or headache.   multivitamin-prenatal 27-0.8 MG Tabs tablet Take 1 tablet by mouth daily at 12 noon.            Durable Medical Equipment  (From admission, onward)         Start     Ordered   06/27/19 1503  For home use only DME Bedside commode  Once    Question Answer Comment  Patient needs a bedside commode to treat with the following condition Left leg pain   Patient needs a bedside commode to treat with the following condition Impaired ambulation      06/27/19 1509   06/27/19 1501  For home use only DME Walker rolling  Once    Question Answer Comment  Walker: With [place]   Patient needs a walker to treat with the following condition Left leg pain   Patient needs a walker to treat with the  following condition Impaired ambulation      06/27/19 1509           Total time spent taking care of this patient: 20 minutes  Signed: Dalia Heading 06/27/2019, 5:33 PM

## 2019-06-27 NOTE — Progress Notes (Addendum)
Pt discharge home per C.Gutierrez, CNM order. Pt had a PT consult and will be set up for outpt PT. Discharge instructions and AVS given to pt.  CNM ordered pt bedside commode, walker, and PRN SI belt use. Pt stated that she can obtain a walker and bedside commode, and shower chair from her grandmother tonight. Case worker- Misty aware. Pt just set up for outpt rehab at the moment. RN reviewed with pt labor precautions and signs/symptoms. Pt verbalized understanding. Pt has follow up appointment with C.Gutierrez, CNM on Friday 06/28/2018. Pt d/c home  in stable condition. Pt d/c c via wheelchair to personal vehicle with significant other.

## 2019-06-28 ENCOUNTER — Telehealth: Payer: Self-pay | Admitting: Obstetrics and Gynecology

## 2019-06-28 ENCOUNTER — Encounter: Payer: Medicaid Other | Admitting: Obstetrics and Gynecology

## 2019-06-28 ENCOUNTER — Ambulatory Visit: Payer: Medicaid Other

## 2019-06-28 NOTE — Telephone Encounter (Signed)
Patient was schedule today and tomorrow for ultrasound and ROB. Patient reschedule due to not having someone to drive her. Patient state she had a test done and is wanting to know the results. Please advise

## 2019-06-29 ENCOUNTER — Encounter: Payer: Medicaid Other | Admitting: Certified Nurse Midwife

## 2019-07-03 ENCOUNTER — Other Ambulatory Visit: Payer: Self-pay

## 2019-07-03 ENCOUNTER — Ambulatory Visit (INDEPENDENT_AMBULATORY_CARE_PROVIDER_SITE_OTHER): Payer: Medicaid Other | Admitting: Certified Nurse Midwife

## 2019-07-03 ENCOUNTER — Ambulatory Visit (INDEPENDENT_AMBULATORY_CARE_PROVIDER_SITE_OTHER): Payer: Medicaid Other

## 2019-07-03 VITALS — BP 124/88 | Wt 252.0 lb

## 2019-07-03 DIAGNOSIS — Z8619 Personal history of other infectious and parasitic diseases: Secondary | ICD-10-CM

## 2019-07-03 DIAGNOSIS — O99213 Obesity complicating pregnancy, third trimester: Secondary | ICD-10-CM

## 2019-07-03 DIAGNOSIS — O34219 Maternal care for unspecified type scar from previous cesarean delivery: Secondary | ICD-10-CM

## 2019-07-03 DIAGNOSIS — Z3A36 36 weeks gestation of pregnancy: Secondary | ICD-10-CM

## 2019-07-03 DIAGNOSIS — Z3685 Encounter for antenatal screening for Streptococcus B: Secondary | ICD-10-CM

## 2019-07-03 DIAGNOSIS — Z3480 Encounter for supervision of other normal pregnancy, unspecified trimester: Secondary | ICD-10-CM

## 2019-07-03 LAB — POCT URINALYSIS DIPSTICK OB: Glucose, UA: NEGATIVE

## 2019-07-03 MED ORDER — DIBUCAINE (PERIANAL) 1 % EX OINT: TOPICAL_OINTMENT | CUTANEOUS | 0 refills | Status: DC

## 2019-07-03 MED ORDER — VALACYCLOVIR HCL 500 MG PO TABS: 500.0000 mg | ORAL_TABLET | Freq: Two times a day (BID) | ORAL | 1 refills | Status: DC

## 2019-07-03 NOTE — Progress Notes (Signed)
No concerns.rj 

## 2019-07-03 NOTE — Progress Notes (Signed)
ROB at 36wk3d: Seen in L&D last week for left sided low back pain with sciatic pain. PT was consulted. Was given an SI belt and exercises to do. Has also been using Biofreeze and Tylenol.   She reports that she has been feeling  much better. Baby active. Having some mild contractions On ultrasound today, baby is cephalic and EFW 2#5DG (35.3%) , AFI 15.7cm Is interested in VBAC. Discussed option of setting Cesarean section/ induction date if she does not go into labor spontaneously. Explained that there is an increased risk of uterine rupture with induction of labor and Cytotec is not used for that reason. Patient will think about her options and let us know her decision at her next visit. 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  Would like to begin antiviral PPX. Valtrex 500 mgm BID sent to pharmacy. GBS done ROB in 1 week.   Farrel Conners, CNM

## 2019-07-04 LAB — OB RESULTS CONSOLE GBS: GBS: NEGATIVE

## 2019-07-05 DIAGNOSIS — Z8619 Personal history of other infectious and parasitic diseases: Secondary | ICD-10-CM | POA: Insufficient documentation

## 2019-07-08 LAB — CULTURE, BETA STREP (GROUP B ONLY): Strep Gp B Culture: NEGATIVE

## 2019-07-10 ENCOUNTER — Ambulatory Visit (INDEPENDENT_AMBULATORY_CARE_PROVIDER_SITE_OTHER): Payer: Medicaid Other | Admitting: Certified Nurse Midwife

## 2019-07-10 ENCOUNTER — Other Ambulatory Visit: Payer: Self-pay

## 2019-07-10 VITALS — BP 126/88 | Temp 97.5°F | Wt 253.0 lb

## 2019-07-10 DIAGNOSIS — O34219 Maternal care for unspecified type scar from previous cesarean delivery: Secondary | ICD-10-CM

## 2019-07-10 DIAGNOSIS — Z3A37 37 weeks gestation of pregnancy: Secondary | ICD-10-CM

## 2019-07-10 DIAGNOSIS — Z8619 Personal history of other infectious and parasitic diseases: Secondary | ICD-10-CM

## 2019-07-10 DIAGNOSIS — Z3480 Encounter for supervision of other normal pregnancy, unspecified trimester: Secondary | ICD-10-CM

## 2019-07-10 DIAGNOSIS — Z3483 Encounter for supervision of other normal pregnancy, third trimester: Secondary | ICD-10-CM

## 2019-07-10 LAB — POCT URINALYSIS DIPSTICK OB: Glucose, UA: NEGATIVE

## 2019-07-10 NOTE — Progress Notes (Signed)
No concerns.rj 

## 2019-07-13 NOTE — Progress Notes (Signed)
ROB at 37wk3days: Back pain basically resolved. Feeling much better. Baby moving well. Taking her Valtrex Would like to deliver vaginally if possible. Discussed risks of uterine rupture with spontaneous labor at <1%. Explained that risks increase with IOL and that certain medications are not used for induction when there is a prior uterine incision. Recovery is usually shorter for a vaginal delivery c/w a Cesarean section. Mutual decision to schedule CS or IOL if cerviix is ripe at 40 weeks (23 February) GBS negative RTO in 1 week for ROB Labor Precautions  Farrel Conners, CNM

## 2019-07-16 ENCOUNTER — Telehealth: Payer: Self-pay | Admitting: Obstetrics and Gynecology

## 2019-07-16 NOTE — Telephone Encounter (Signed)
Patient is aware of H&P on 2/11 @ 9:10am w/ Dr. Jean Rosenthal at [facility] (directions given), Pre-admit testing to be scheduled, COVID testing on 2/22, and OR on 07/31/19. Patient is aware to quarantine after COVID testing.

## 2019-07-16 NOTE — Telephone Encounter (Signed)
-----   Message from Farrel Conners, [place] sent at 07/13/2019  9:13 PM EST ----- Regarding: Schedule Cesarean section Surgery Booking Request Patient Full Name:  Brandi Solomon  MRN: [id number]  DOB: [birth year]  Surgeon: Dr Jean Rosenthal Requested Surgery Date and Time: 23 February Primary Diagnosis AND Code: Previous Cesarean section Secondary Diagnosis and Code:  Surgical Procedure: Cesarean Section L&D Notification: Yes, patient may have induction of labor this day if cervix is ripe, if not, then CS Admission Status: surgery admit Length of Surgery: 1-2 hours Special Case Needs: No H&P: Yes, needs Phone Interview???:  Yes Interpreter: No Language:  Medical Clearance:  No Special Scheduling Instructions: no Any known health/anesthesia issues, diabetes, sleep apnea, latex allergy, defibrillator/pacemaker?: No Acuity: P1   (P1 highest, P2 delay may cause harm, P3 low, elective gyn, P4 lowest)

## 2019-07-19 ENCOUNTER — Ambulatory Visit (INDEPENDENT_AMBULATORY_CARE_PROVIDER_SITE_OTHER): Payer: Medicaid Other | Admitting: Obstetrics and Gynecology

## 2019-07-19 ENCOUNTER — Encounter: Payer: Self-pay | Admitting: Obstetrics and Gynecology

## 2019-07-19 ENCOUNTER — Encounter: Payer: Medicaid Other | Admitting: Certified Nurse Midwife

## 2019-07-19 ENCOUNTER — Other Ambulatory Visit: Payer: Self-pay

## 2019-07-19 VITALS — BP 133/77 | Wt 257.0 lb

## 2019-07-19 DIAGNOSIS — O99213 Obesity complicating pregnancy, third trimester: Secondary | ICD-10-CM

## 2019-07-19 DIAGNOSIS — F32A Depression, unspecified: Secondary | ICD-10-CM

## 2019-07-19 DIAGNOSIS — O99343 Other mental disorders complicating pregnancy, third trimester: Secondary | ICD-10-CM

## 2019-07-19 DIAGNOSIS — F329 Major depressive disorder, single episode, unspecified: Secondary | ICD-10-CM

## 2019-07-19 DIAGNOSIS — Z3480 Encounter for supervision of other normal pregnancy, unspecified trimester: Secondary | ICD-10-CM

## 2019-07-19 DIAGNOSIS — Z8619 Personal history of other infectious and parasitic diseases: Secondary | ICD-10-CM

## 2019-07-19 DIAGNOSIS — Z3A38 38 weeks gestation of pregnancy: Secondary | ICD-10-CM

## 2019-07-19 DIAGNOSIS — O09893 Supervision of other high risk pregnancies, third trimester: Secondary | ICD-10-CM

## 2019-07-19 DIAGNOSIS — O34219 Maternal care for unspecified type scar from previous cesarean delivery: Secondary | ICD-10-CM

## 2019-07-19 NOTE — Progress Notes (Signed)
OB History & Physical   History of Present Illness:  Chief Complaint: Pre-operative visit for repeat c-section or for Induction of labor for Trial of labor after cesarean  HPI:  Brandi Solomon is a 29 y.o. [id number] female at [redacted]w[redacted]d dated by 12 week ultrasound.  Her pregnancy has been complicated by history of cesaren delivery (breech), HSV.    She denies contractions.   She denies leakage of fluid.   She denies vaginal bleeding.   She reports fetal movement.    Total weight gain for pregnancy: 14 lb (6.35 kg)   Obstetrical Problem List: pregnancy #4 Problems (from 10/06/18 to present)    Problem Noted Resolved   Previous cesarean delivery, antepartum condition or complication 19/50/9326 by Gatha Mayer, MD No   Overview Addendum 05/24/2019  8:40 AM by Homero Fellers, MD    Done for breech Pt desires TOLAC   MFMU calculator 66% success   [email or web address] Predicted chance of vaginal birth after cesarean:  58.3% 95% confidence interval:  [54.0%, 62.5%]      Depression affecting pregnancy 01/05/2019 by Homero Fellers, MD No   Supervision of other normal pregnancy, antepartum 01/05/2019 by Homero Fellers, MD No   Overview Addendum 07/08/2019  8:44 AM by Dalia Heading, [place] Prenatal Labs  Dating  12 wk [place] Blood type: A/Positive/-- (08/14 1611)   Genetic Screen NIPS: Negative XX Antibody:Negative (08/14 1611)  Anatomic [place] incomplete Rubella: 2.18 (08/14 1611) Varicella: Immune  GTT  28 wk:    116  RPR: Non Reactive (08/14 1611)   Rhogam  not needed HBsAg: Negative (08/14 1611)   TDaP vaccine  05/24/2019                     HIV: Non Reactive (08/14 1611)   Flu Shot   03/14/2019                             GBS: negative  Contraception  undecided, considering Depo vs Nexplanon/IUD Pap: 01/05/2019 NILM  CBB     CS/VBAC 2010/breech- Desires TOLAC, counseled on risks   Baby Food   Breast- RSB discussed   Support Person                 Maternal Medical History:   Past Medical History:  Diagnosis Date  . Asthma   . Depression affecting pregnancy   . Obesity (BMI 30-39.9)     Past Surgical History:  Procedure Laterality Date  . CESAREAN SECTION      No Known Allergies  Prior to Admission medications   Medication Sig Start Date End Date Taking? Authorizing Provider  acetaminophen (TYLENOL) 500 MG tablet Take 2 tablets (1,000 mg total) by mouth every 6 (six) hours as needed for fever or headache. Patient not taking: Reported on 07/18/2019 06/27/19   Dalia Heading, CNM  dibucaine (NUPERCAINAL) 1 % OINT Apply to lesions BID prn Patient not taking: Reported on 07/10/2019 07/03/19   Dalia Heading, CNM  Prenatal Vit-Fe Fumarate-FA (MULTIVITAMIN-PRENATAL) 27-0.8 MG TABS tablet Take 2 tablets by mouth daily. Gummie    [provider]  valACYclovir (VALTREX) 500 MG tablet Take 1 tablet (500 mg total) by mouth 2 (two) times daily. 07/03/19   Dalia Heading, CNM    OB History  Gravida Para Term Preterm AB Living  4 1 1   2 1   SAB TAB Ectopic Multiple Live  Births  2       1    # Outcome Date GA Lbr Len/2nd Weight Sex Delivery Anes PTL Lv  4 Current           3 Term 08/01/08 [redacted]w[redacted]d   F CS-LTranv   LIV     Birth Comments: cesarean for breech  2 SAB           1 SAB             Prenatal care site: [facility]  Social History: She  reports that she has quit smoking. She has never used smokeless tobacco. She reports previous alcohol use. She reports that she does not use drugs.  Family History: family history includes Diabetes Mellitus II in her maternal aunt; Hypertension in her maternal aunt; Skin cancer in her maternal aunt; Throat cancer in her maternal aunt.   Review of Systems:  Review of Systems  Constitutional: Negative.   HENT: Negative.   Eyes: Negative.   Respiratory: Negative.   Cardiovascular: Negative.    Gastrointestinal: Negative.   Genitourinary: Negative.   Musculoskeletal: Negative.   Skin: Negative.   Neurological: Negative.   Psychiatric/Behavioral: Negative.      Physical Exam:  BP 133/77   Wt 257 lb (116.6 kg)   LMP 10/06/2018 (Exact Date)   BMI 35.84 kg/m   Physical Exam Constitutional:      General: She is not in acute distress.    Appearance: Normal appearance. She is well-developed.  HENT:     Head: Normocephalic and atraumatic.  Eyes:     General: No scleral icterus.    Conjunctiva/sclera: Conjunctivae normal.  Cardiovascular:     Rate and Rhythm: Normal rate and regular rhythm.     Heart sounds: No murmur. No friction rub. No gallop.   Pulmonary:     Effort: Pulmonary effort is normal. No respiratory distress.     Breath sounds: Normal breath sounds. No wheezing or rales.  Abdominal:     General: Bowel sounds are normal. There is no distension.     Palpations: Abdomen is soft.     Comments: Gravid, NT  Musculoskeletal:        General: Normal range of motion.     Cervical back: Normal range of motion and neck supple.  Neurological:     General: No focal deficit present.     Mental Status: She is alert and oriented to person, place, and time.     Cranial Nerves: No cranial nerve deficit.  Skin:    General: Skin is warm and dry.     Findings: No erythema.  Psychiatric:        Mood and Affect: Mood normal.        Behavior: Behavior normal.        Judgment: Judgment normal.   FHR: 130 bpm  No results found for: [id number]]  Assessment:  Brandi Solomon is a 29 y.o. G24P1021 female at [redacted]w[redacted]d with history of cesarean delivery.   Plan:  1. Admit to Labor & Delivery  2. CBC, T&S, Clrs, IVF 3. GBS negative.   4. History of cesarean: If goes in labor, she would like to have a trial of labor. She has been previously counseled regarding the risks/benefits of TOLAC versus c-section. If no labor by her scheduled c-section date, she states that she would  like to proceed with planned c-section. Consents reviewed and signed today.   Thomasene Mohair, MD 07/19/2019 9:30 AM

## 2019-07-22 ENCOUNTER — Inpatient Hospital Stay
Admission: EM | Admit: 2019-07-22 | Discharge: 2019-07-25 | DRG: 787 | Disposition: A | Discharge status: Home / Self Care | Payer: Medicaid Other | Attending: Obstetrics & Gynecology | Admitting: Obstetrics & Gynecology

## 2019-07-22 ENCOUNTER — Other Ambulatory Visit: Payer: Self-pay

## 2019-07-22 ENCOUNTER — Encounter: Payer: Self-pay | Admitting: Obstetrics and Gynecology

## 2019-07-22 DIAGNOSIS — O34218 Maternal care for other type scar from previous cesarean delivery: Secondary | ICD-10-CM | POA: Diagnosis not present

## 2019-07-22 DIAGNOSIS — O4292 Full-term premature rupture of membranes, unspecified as to length of time between rupture and onset of labor: Principal | ICD-10-CM | POA: Diagnosis present

## 2019-07-22 DIAGNOSIS — O26893 Other specified pregnancy related conditions, third trimester: Secondary | ICD-10-CM | POA: Diagnosis present

## 2019-07-22 DIAGNOSIS — O34211 Maternal care for low transverse scar from previous cesarean delivery: Secondary | ICD-10-CM | POA: Diagnosis present

## 2019-07-22 DIAGNOSIS — Z87891 Personal history of nicotine dependence: Secondary | ICD-10-CM

## 2019-07-22 DIAGNOSIS — O99340 Other mental disorders complicating pregnancy, unspecified trimester: Secondary | ICD-10-CM

## 2019-07-22 DIAGNOSIS — F329 Major depressive disorder, single episode, unspecified: Secondary | ICD-10-CM

## 2019-07-22 DIAGNOSIS — Z3A39 39 weeks gestation of pregnancy: Secondary | ICD-10-CM

## 2019-07-22 DIAGNOSIS — O139 Gestational [pregnancy-induced] hypertension without significant proteinuria, unspecified trimester: Secondary | ICD-10-CM

## 2019-07-22 DIAGNOSIS — O34219 Maternal care for unspecified type scar from previous cesarean delivery: Secondary | ICD-10-CM

## 2019-07-22 DIAGNOSIS — O9081 Anemia of the puerperium: Secondary | ICD-10-CM | POA: Diagnosis not present

## 2019-07-22 DIAGNOSIS — E669 Obesity, unspecified: Secondary | ICD-10-CM | POA: Diagnosis present

## 2019-07-22 DIAGNOSIS — Z98891 History of uterine scar from previous surgery: Secondary | ICD-10-CM

## 2019-07-22 DIAGNOSIS — O4202 Full-term premature rupture of membranes, onset of labor within 24 hours of rupture: Secondary | ICD-10-CM

## 2019-07-22 DIAGNOSIS — Z20822 Contact with and (suspected) exposure to covid-19: Secondary | ICD-10-CM | POA: Diagnosis present

## 2019-07-22 DIAGNOSIS — O99214 Obesity complicating childbirth: Secondary | ICD-10-CM | POA: Diagnosis present

## 2019-07-22 DIAGNOSIS — D62 Acute posthemorrhagic anemia: Secondary | ICD-10-CM | POA: Diagnosis not present

## 2019-07-22 DIAGNOSIS — F32A Depression, unspecified: Secondary | ICD-10-CM

## 2019-07-22 DIAGNOSIS — O134 Gestational [pregnancy-induced] hypertension without significant proteinuria, complicating childbirth: Secondary | ICD-10-CM | POA: Diagnosis present

## 2019-07-22 DIAGNOSIS — Z3480 Encounter for supervision of other normal pregnancy, unspecified trimester: Secondary | ICD-10-CM

## 2019-07-22 DIAGNOSIS — O4290 Premature rupture of membranes, unspecified as to length of time between rupture and onset of labor, unspecified weeks of gestation: Secondary | ICD-10-CM | POA: Diagnosis present

## 2019-07-22 LAB — COMPREHENSIVE METABOLIC PANEL
ALT: 14 U/L (ref 0–44)
AST: 15 U/L (ref 15–41)
Albumin: 2.6 g/dL — ABNORMAL LOW (ref 3.5–5.0)
Alkaline Phosphatase: 128 U/L — ABNORMAL HIGH (ref 38–126)
Anion gap: 6 (ref 5–15)
BUN: 9 mg/dL (ref 6–20)
CO2: 24 mmol/L (ref 22–32)
Calcium: 8.5 mg/dL — ABNORMAL LOW (ref 8.9–10.3)
Chloride: 106 mmol/L (ref 98–111)
Creatinine, Ser: 0.45 mg/dL (ref 0.44–1.00)
GFR calc Af Amer: >60 mL/min (ref >60)
GFR calc non Af Amer: >60 mL/min (ref >60)
Glucose, Bld: 72 mg/dL (ref 70–99)
Potassium: 4.7 mmol/L (ref 3.5–5.1)
Sodium: 136 mmol/L (ref 135–145)
Total Bilirubin: 0.4 mg/dL (ref 0.3–1.2)
Total Protein: 5.7 g/dL — ABNORMAL LOW (ref 6.5–8.1)

## 2019-07-22 LAB — CBC
HCT: 34 % — ABNORMAL LOW (ref 36.0–46.0)
Hemoglobin: 11.1 g/dL — ABNORMAL LOW (ref 12.0–15.0)
MCH: 29.1 pg (ref 26.0–34.0)
MCHC: 32.6 g/dL (ref 30.0–36.0)
MCV: 89 fL (ref 80.0–100.0)
Platelets: 221 10*3/uL (ref 150–400)
RBC: 3.82 MIL/uL — ABNORMAL LOW (ref 3.87–5.11)
RDW: 13.9 % (ref 11.5–15.5)
WBC: 7.7 10*3/uL (ref 4.0–10.5)
nRBC: 0 % (ref 0.0–0.2)

## 2019-07-22 LAB — RAPID HIV SCREEN (HIV 1/2 AB+AG)
HIV 1/2 Antibodies: NONREACTIVE
HIV-1 P24 Antigen - HIV24: NONREACTIVE

## 2019-07-22 LAB — RESPIRATORY PANEL BY RT PCR (FLU A&B, COVID)
Influenza A by PCR: NEGATIVE
Influenza B by PCR: NEGATIVE
SARS Coronavirus 2 by RT PCR: NEGATIVE

## 2019-07-22 LAB — PROTEIN / CREATININE RATIO, URINE
Creatinine, Urine: 117 mg/dL
Protein Creatinine Ratio: 0.15 mg/mg{Cre} (ref 0.00–0.15)
Total Protein, Urine: 18 mg/dL

## 2019-07-22 LAB — TYPE AND SCREEN
ABO/RH(D): A POS
Antibody Screen: NEGATIVE

## 2019-07-22 MED ORDER — OXYTOCIN 10 UNIT/ML IJ SOLN
INTRAMUSCULAR | Status: AC
  Filled 2019-07-22: qty 2

## 2019-07-22 MED ORDER — TERBUTALINE SULFATE 1 MG/ML IJ SOLN: 0.2500 mg | Freq: Once | INTRAMUSCULAR | Status: DC | PRN

## 2019-07-22 MED ORDER — MISOPROSTOL 200 MCG PO TABS
ORAL_TABLET | ORAL | Status: AC
  Filled 2019-07-22: qty 4

## 2019-07-22 MED ORDER — BUTORPHANOL TARTRATE 1 MG/ML IJ SOLN
2.0000 mg | INTRAMUSCULAR | Status: DC | PRN
  Administered 2019-07-22 – 2019-07-23 (×2): 2 mg via INTRAVENOUS
  Filled 2019-07-22 (×2): qty 2

## 2019-07-22 MED ORDER — OXYTOCIN 40 UNITS IN NORMAL SALINE INFUSION - SIMPLE MED
2.5000 [IU]/h | INTRAVENOUS | Status: DC
  Administered 2019-07-23: 40 [IU] via INTRAVENOUS

## 2019-07-22 MED ORDER — LIDOCAINE HCL (PF) 1 % IJ SOLN: 30.0000 mL | INTRAMUSCULAR | Status: DC | PRN

## 2019-07-22 MED ORDER — LIDOCAINE HCL (PF) 1 % IJ SOLN
INTRAMUSCULAR | Status: AC
  Filled 2019-07-22: qty 30

## 2019-07-22 MED ORDER — AMMONIA AROMATIC IN INHA
RESPIRATORY_TRACT | Status: AC
  Filled 2019-07-22: qty 10

## 2019-07-22 MED ORDER — OXYTOCIN 40 UNITS IN NORMAL SALINE INFUSION - SIMPLE MED
INTRAVENOUS | Status: AC
  Administered 2019-07-22: 14:00:00 2 m[IU]/min via INTRAVENOUS
  Filled 2019-07-22: qty 1000

## 2019-07-22 MED ORDER — LACTATED RINGERS IV SOLN: 500.0000 mL | INTRAVENOUS | Status: DC | PRN

## 2019-07-22 MED ORDER — OXYTOCIN BOLUS FROM INFUSION: 500.0000 mL | Freq: Once | INTRAVENOUS | Status: DC

## 2019-07-22 MED ORDER — ONDANSETRON HCL 4 MG/2ML IJ SOLN: 4.0000 mg | Freq: Four times a day (QID) | INTRAMUSCULAR | Status: DC | PRN

## 2019-07-22 MED ORDER — LACTATED RINGERS IV SOLN: INTRAVENOUS | Status: DC

## 2019-07-22 MED ORDER — SOD CITRATE-CITRIC ACID 500-334 MG/5ML PO SOLN
30.0000 mL | ORAL | Status: DC | PRN
  Filled 2019-07-22: qty 30

## 2019-07-22 MED ORDER — OXYTOCIN 40 UNITS IN NORMAL SALINE INFUSION - SIMPLE MED: 1.0000 m[IU]/min | INTRAVENOUS | Status: DC

## 2019-07-22 MED ORDER — ACETAMINOPHEN 325 MG PO TABS: 650.0000 mg | ORAL_TABLET | ORAL | Status: DC | PRN

## 2019-07-22 NOTE — H&P (Signed)
Brandi Solomon is an 29 y.o. female.   Chief Complaint: Leakage of fluid  HPI: Patient presents today to labor and delivery with complaints of spontaneous rupture of membranes.  She reports that she started leaking clear fluid at 8:30 AM.  She denies any uterine contractions.  She is resting comfortably in bed.  She is feeling normal fetal movement.  She denies any vaginal bleeding.  She denies any headache or right upper quadrant pain.   Past Medical History:  Diagnosis Date  . Asthma   . Depression affecting pregnancy   . Obesity (BMI 30-39.9)     Past Surgical History:  Procedure Laterality Date  . CESAREAN SECTION      Family History  Problem Relation Age of Onset  . Throat cancer Maternal Aunt   . Skin cancer Maternal Aunt   . Hypertension Maternal Aunt   . Diabetes Mellitus II Maternal Aunt    Social History:  reports that she has quit smoking. She has never used smokeless tobacco. She reports previous alcohol use. She reports that she does not use drugs.  Allergies: No Known Allergies  Medications Prior to Admission  Medication Sig Dispense Refill  . Prenatal Vit-Fe Fumarate-FA (MULTIVITAMIN-PRENATAL) 27-0.8 MG TABS tablet Take 2 tablets by mouth daily. Gummie    . valACYclovir (VALTREX) 500 MG tablet Take 1 tablet (500 mg total) by mouth 2 (two) times daily. 60 tablet 1  . acetaminophen (TYLENOL) 500 MG tablet Take 2 tablets (1,000 mg total) by mouth every 6 (six) hours as needed for fever or headache. (Patient not taking: Reported on 07/18/2019) 30 tablet 0  . dibucaine (NUPERCAINAL) 1 % OINT Apply to lesions BID prn (Patient not taking: Reported on 07/10/2019) 28 g 0    No results found for this or any previous visit (from the past 48 hour(s)). No results found.  Review of Systems  Constitutional: Negative for chills and fever.  HENT: Negative for congestion, hearing loss and sinus pain.   Respiratory: Negative for cough, shortness of breath and wheezing.    Cardiovascular: Negative for chest pain, palpitations and leg swelling.  Gastrointestinal: Negative for abdominal pain, constipation, diarrhea, nausea and vomiting.  Genitourinary: Negative for dysuria, flank pain, frequency, hematuria and urgency.  Musculoskeletal: Negative for back pain.  Skin: Negative for rash.  Neurological: Negative for dizziness and headaches.  Psychiatric/Behavioral: Negative for suicidal ideas. The patient is not nervous/anxious.    Temperature 98.5 F (36.9 C), temperature source Oral, resp. rate 20, height 5\' 11"  (1.803 m), weight 111.6 kg, last menstrual period 10/06/2018, unknown if currently breastfeeding.   Physical Exam  Nursing note and vitals reviewed. Constitutional: She is oriented to person, place, and time. She appears well-developed and well-nourished.  HENT:  Head: Normocephalic and atraumatic.  Cardiovascular: Normal rate and regular rhythm.  Respiratory: Effort normal and breath sounds normal.  GI: Soft. Bowel sounds are normal.  Musculoskeletal:        General: Normal range of motion.  Neurological: She is alert and oriented to person, place, and time.  Skin: Skin is warm and dry.  Psychiatric: She has a normal mood and affect. Her behavior is normal. Judgment and thought content normal.    Grossly ruptured with clear fluid on speculum exam.  No lesions seen on speculum exam. NST: 125 bpm baseline, moderate variability, 15 x 15 accelerations, no decelerations. Tocometer : No contractions  Assessment/Plan  29 year old G4 P1-0-2-1 at 73 weeks and 1 day gestational age presents today with prelabor rupture  of membranes. 1.  Will admit for induction of labor and trial of labor after cesarean patient has been counseled regarding risks. 2. Will start induction with IV Pitocin 3.  GBS negative-no antibiotics required 4.  Discussed options for pain control during labor  5. Elevated initial BP. Will send Preeclampsia labs for evaluation-  monitor.   Discussed with Kamrin and her significant other risks of trial of labor after cesarean including risk of uterine rupture which is approximately 1 and 100.  They understand that if the uterine rupture is to recur there is a high likelihood of fetal brain injury neurological sequelae and/or death to the fetus as well as risks to mom including risk of hemorrhage risk of emergent surgery risk of infection risk of damage to the uterus requiring hysterectomy.  She understands these risks and desires to proceed with a induction of labor for a trial of labor after cesarean section.     Natale Milch, MD 07/22/2019, 12:04 PM

## 2019-07-22 NOTE — Progress Notes (Signed)
Not able to reach cervix on SVE, patient very uncomfortable with exam.  Requesting stadol.  Continue with IV pitocin for IOL.  Reassuring fetal heart rate tracing.   Adelene Idler MD [facility], [facility] 07/22/2019 9:10 PM

## 2019-07-23 ENCOUNTER — Encounter (HOSPITAL_COMMUNITY): Payer: Self-pay | Admitting: Anesthesiology

## 2019-07-23 ENCOUNTER — Inpatient Hospital Stay: Payer: Medicaid Other | Admitting: Anesthesiology

## 2019-07-23 ENCOUNTER — Encounter
Admission: EM | Disposition: A | Discharge status: Home / Self Care | Payer: Self-pay | Source: Home / Self Care | Attending: Obstetrics & Gynecology

## 2019-07-23 ENCOUNTER — Other Ambulatory Visit: Payer: Self-pay

## 2019-07-23 ENCOUNTER — Encounter: Payer: Self-pay | Admitting: Obstetrics and Gynecology

## 2019-07-23 DIAGNOSIS — Z98891 History of uterine scar from previous surgery: Secondary | ICD-10-CM

## 2019-07-23 DIAGNOSIS — O34218 Maternal care for other type scar from previous cesarean delivery: Secondary | ICD-10-CM

## 2019-07-23 DIAGNOSIS — O4292 Full-term premature rupture of membranes, unspecified as to length of time between rupture and onset of labor: Secondary | ICD-10-CM

## 2019-07-23 HISTORY — DX: History of uterine scar from previous surgery: Z98.891

## 2019-07-23 LAB — COMPREHENSIVE METABOLIC PANEL
ALT: 14 U/L (ref 0–44)
AST: 20 U/L (ref 15–41)
Albumin: 2.4 g/dL — ABNORMAL LOW (ref 3.5–5.0)
Alkaline Phosphatase: 115 U/L (ref 38–126)
Anion gap: 6 (ref 5–15)
BUN: 8 mg/dL (ref 6–20)
CO2: 24 mmol/L (ref 22–32)
Calcium: 8.3 mg/dL — ABNORMAL LOW (ref 8.9–10.3)
Chloride: 109 mmol/L (ref 98–111)
Creatinine, Ser: 0.53 mg/dL (ref 0.44–1.00)
GFR calc Af Amer: >60 mL/min (ref >60)
GFR calc non Af Amer: >60 mL/min (ref >60)
Glucose, Bld: 87 mg/dL (ref 70–99)
Potassium: 3.9 mmol/L (ref 3.5–5.1)
Sodium: 139 mmol/L (ref 135–145)
Total Bilirubin: 0.3 mg/dL (ref 0.3–1.2)
Total Protein: 5.4 g/dL — ABNORMAL LOW (ref 6.5–8.1)

## 2019-07-23 LAB — CBC
HCT: 34.2 % — ABNORMAL LOW (ref 36.0–46.0)
Hemoglobin: 11.3 g/dL — ABNORMAL LOW (ref 12.0–15.0)
MCH: 29.4 pg (ref 26.0–34.0)
MCHC: 33 g/dL (ref 30.0–36.0)
MCV: 88.8 fL (ref 80.0–100.0)
Platelets: 227 10*3/uL (ref 150–400)
RBC: 3.85 MIL/uL — ABNORMAL LOW (ref 3.87–5.11)
RDW: 14.1 % (ref 11.5–15.5)
WBC: 10.2 10*3/uL (ref 4.0–10.5)
nRBC: 0 % (ref 0.0–0.2)

## 2019-07-23 LAB — PROTEIN / CREATININE RATIO, URINE
Creatinine, Urine: 79 mg/dL
Protein Creatinine Ratio: 0.37 mg/mg{Cre} — ABNORMAL HIGH (ref 0.00–0.15)
Total Protein, Urine: 29 mg/dL

## 2019-07-23 LAB — RPR: RPR Ser Ql: NONREACTIVE

## 2019-07-23 SURGERY — Surgical Case
Anesthesia: Epidural

## 2019-07-23 MED ORDER — EPHEDRINE 5 MG/ML INJ: 10.0000 mg | INTRAVENOUS | Status: DC | PRN

## 2019-07-23 MED ORDER — OXYCODONE-ACETAMINOPHEN 5-325 MG PO TABS: 1.0000 | ORAL_TABLET | ORAL | Status: DC | PRN

## 2019-07-23 MED ORDER — ZOLPIDEM TARTRATE 5 MG PO TABS: 5.0000 mg | ORAL_TABLET | Freq: Every evening | ORAL | Status: DC | PRN

## 2019-07-23 MED ORDER — LACTATED RINGERS IV SOLN: INTRAVENOUS | Status: DC

## 2019-07-23 MED ORDER — SIMETHICONE 80 MG PO CHEW: 80.0000 mg | CHEWABLE_TABLET | ORAL | Status: DC | PRN

## 2019-07-23 MED ORDER — LABETALOL HCL 5 MG/ML IV SOLN: 40.0000 mg | INTRAVENOUS | Status: DC | PRN

## 2019-07-23 MED ORDER — SIMETHICONE 80 MG PO CHEW
80.0000 mg | CHEWABLE_TABLET | Freq: Three times a day (TID) | ORAL | Status: DC
  Administered 2019-07-23 – 2019-07-25 (×6): 80 mg via ORAL
  Filled 2019-07-23 (×6): qty 1

## 2019-07-23 MED ORDER — SCOPOLAMINE 1 MG/3DAYS TD PT72
1.0000 | MEDICATED_PATCH | Freq: Once | TRANSDERMAL | Status: DC
  Filled 2019-07-23: qty 1

## 2019-07-23 MED ORDER — SOD CITRATE-CITRIC ACID 500-334 MG/5ML PO SOLN: 30.0000 mL | ORAL | Status: DC

## 2019-07-23 MED ORDER — CEFAZOLIN SODIUM-DEXTROSE 2-4 GM/100ML-% IV SOLN
2.0000 g | INTRAVENOUS | Status: DC
  Filled 2019-07-23: qty 100

## 2019-07-23 MED ORDER — FENTANYL CITRATE (PF) 100 MCG/2ML IJ SOLN
INTRAMUSCULAR | Status: DC | PRN
  Administered 2019-07-23: 100 ug via EPIDURAL

## 2019-07-23 MED ORDER — LACTATED RINGERS AMNIOINFUSION
INTRAVENOUS | Status: DC
  Filled 2019-07-23: qty 1000

## 2019-07-23 MED ORDER — EPHEDRINE SULFATE 50 MG/ML IJ SOLN
INTRAMUSCULAR | Status: AC
  Filled 2019-07-23: qty 1

## 2019-07-23 MED ORDER — PRENATAL MULTIVITAMIN CH
1.0000 | ORAL_TABLET | Freq: Every day | ORAL | Status: DC
  Administered 2019-07-24 – 2019-07-25 (×2): 1 via ORAL
  Filled 2019-07-23 (×2): qty 1

## 2019-07-23 MED ORDER — METHYLERGONOVINE MALEATE 0.2 MG/ML IJ SOLN
INTRAMUSCULAR | Status: AC
  Filled 2019-07-23: qty 1

## 2019-07-23 MED ORDER — WITCH HAZEL-GLYCERIN EX PADS: 1.0000 "application " | MEDICATED_PAD | CUTANEOUS | Status: DC | PRN

## 2019-07-23 MED ORDER — PROPOFOL 10 MG/ML IV BOLUS
INTRAVENOUS | Status: AC
  Filled 2019-07-23: qty 20

## 2019-07-23 MED ORDER — PHENYLEPHRINE HCL-NACL 10-0.9 MG/250ML-% IV SOLN
INTRAVENOUS | Status: DC | PRN
  Administered 2019-07-23: 50 ug/min via INTRAVENOUS

## 2019-07-23 MED ORDER — DIPHENHYDRAMINE HCL 25 MG PO CAPS: 25.0000 mg | ORAL_CAPSULE | Freq: Four times a day (QID) | ORAL | Status: DC | PRN

## 2019-07-23 MED ORDER — LABETALOL HCL 5 MG/ML IV SOLN
INTRAVENOUS | Status: AC
  Administered 2019-07-23: 11:00:00 20 mg via INTRAVENOUS
  Filled 2019-07-23: qty 4

## 2019-07-23 MED ORDER — FENTANYL 2.5 MCG/ML W/ROPIVACAINE 0.15% IN NS 100 ML EPIDURAL (ARMC): 12.0000 mL/h | EPIDURAL | Status: DC

## 2019-07-23 MED ORDER — ACETAMINOPHEN 500 MG PO TABS
1000.0000 mg | ORAL_TABLET | Freq: Four times a day (QID) | ORAL | Status: AC
  Administered 2019-07-23 – 2019-07-24 (×4): 1000 mg via ORAL
  Filled 2019-07-23 (×4): qty 2

## 2019-07-23 MED ORDER — FENTANYL 2.5 MCG/ML W/ROPIVACAINE 0.15% IN NS 100 ML EPIDURAL (ARMC)
EPIDURAL | Status: AC
  Filled 2019-07-23: qty 100

## 2019-07-23 MED ORDER — SIMETHICONE 80 MG PO CHEW
80.0000 mg | CHEWABLE_TABLET | ORAL | Status: DC
  Administered 2019-07-23 – 2019-07-25 (×2): 80 mg via ORAL
  Filled 2019-07-23: qty 1

## 2019-07-23 MED ORDER — BUPIVACAINE 0.25 % ON-Q PUMP DUAL CATH 400 ML
400.0000 mL | INJECTION | Status: DC
  Filled 2019-07-23: qty 400

## 2019-07-23 MED ORDER — OXYTOCIN 40 UNITS IN NORMAL SALINE INFUSION - SIMPLE MED
2.5000 [IU]/h | INTRAVENOUS | Status: DC
  Administered 2019-07-23: 13:00:00 2.5 [IU]/h via INTRAVENOUS
  Filled 2019-07-23: qty 1000

## 2019-07-23 MED ORDER — PHENYLEPHRINE 40 MCG/ML (10ML) SYRINGE FOR IV PUSH (FOR BLOOD PRESSURE SUPPORT): 80.0000 ug | PREFILLED_SYRINGE | INTRAVENOUS | Status: DC | PRN

## 2019-07-23 MED ORDER — MORPHINE SULFATE (PF) 2 MG/ML IV SOLN: 1.0000 mg | INTRAVENOUS | Status: DC | PRN

## 2019-07-23 MED ORDER — CEFAZOLIN SODIUM-DEXTROSE 2-4 GM/100ML-% IV SOLN
2.0000 g | INTRAVENOUS | Status: AC
  Administered 2019-07-23: 2 g via INTRAVENOUS
  Filled 2019-07-23: qty 100

## 2019-07-23 MED ORDER — ACETAMINOPHEN 325 MG PO TABS
650.0000 mg | ORAL_TABLET | ORAL | Status: DC | PRN
  Administered 2019-07-24 – 2019-07-25 (×4): 650 mg via ORAL
  Filled 2019-07-23 (×4): qty 2

## 2019-07-23 MED ORDER — BUPIVACAINE HCL (PF) 0.5 % IJ SOLN: 5.0000 mL | Freq: Once | INTRAMUSCULAR | Status: DC

## 2019-07-23 MED ORDER — BUPIVACAINE HCL (PF) 0.5 % IJ SOLN: 10.0000 mL | Freq: Once | INTRAMUSCULAR | Status: DC

## 2019-07-23 MED ORDER — KETOROLAC TROMETHAMINE 30 MG/ML IJ SOLN
30.0000 mg | Freq: Four times a day (QID) | INTRAMUSCULAR | Status: DC
  Administered 2019-07-23: 13:00:00 30 mg via INTRAVENOUS
  Filled 2019-07-23: qty 1

## 2019-07-23 MED ORDER — MENTHOL 3 MG MT LOZG
1.0000 | LOZENGE | OROMUCOSAL | Status: DC | PRN
  Filled 2019-07-23: qty 9

## 2019-07-23 MED ORDER — LIDOCAINE HCL (PF) 2 % IJ SOLN
INTRAMUSCULAR | Status: AC
  Filled 2019-07-23: qty 5

## 2019-07-23 MED ORDER — SUCCINYLCHOLINE CHLORIDE 20 MG/ML IJ SOLN
INTRAMUSCULAR | Status: AC
  Filled 2019-07-23: qty 1

## 2019-07-23 MED ORDER — FENTANYL 2.5 MCG/ML W/ROPIVACAINE 0.15% IN NS 100 ML EPIDURAL (ARMC)
EPIDURAL | Status: DC | PRN
  Administered 2019-07-23: 12 mL/h via EPIDURAL

## 2019-07-23 MED ORDER — DIPHENHYDRAMINE HCL 50 MG/ML IJ SOLN: 12.5000 mg | INTRAMUSCULAR | Status: DC | PRN

## 2019-07-23 MED ORDER — EPHEDRINE SULFATE 50 MG/ML IJ SOLN
INTRAMUSCULAR | Status: DC | PRN
  Administered 2019-07-23: 15 mg via INTRAVENOUS
  Administered 2019-07-23: 10 mg via INTRAVENOUS
  Administered 2019-07-23: 15 mg via INTRAVENOUS
  Administered 2019-07-23: 10 mg via INTRAVENOUS

## 2019-07-23 MED ORDER — LIDOCAINE-EPINEPHRINE (PF) 1.5 %-1:200000 IJ SOLN
INTRAMUSCULAR | Status: DC | PRN
  Administered 2019-07-23: 3 mL via EPIDURAL

## 2019-07-23 MED ORDER — ONDANSETRON HCL 4 MG/2ML IJ SOLN
INTRAMUSCULAR | Status: DC | PRN
  Administered 2019-07-23: 4 mg via INTRAVENOUS

## 2019-07-23 MED ORDER — LABETALOL HCL 5 MG/ML IV SOLN: 20.0000 mg | INTRAVENOUS | Status: DC | PRN

## 2019-07-23 MED ORDER — KETOROLAC TROMETHAMINE 30 MG/ML IJ SOLN
30.0000 mg | Freq: Four times a day (QID) | INTRAMUSCULAR | Status: DC
  Administered 2019-07-23 (×2): 30 mg via INTRAVENOUS
  Filled 2019-07-23 (×2): qty 1

## 2019-07-23 MED ORDER — CARBOPROST TROMETHAMINE 250 MCG/ML IM SOLN
INTRAMUSCULAR | Status: AC
  Filled 2019-07-23: qty 1

## 2019-07-23 MED ORDER — PHENYLEPHRINE HCL (PRESSORS) 10 MG/ML IV SOLN
INTRAVENOUS | Status: DC | PRN
  Administered 2019-07-23: 150 ug via INTRAVENOUS
  Administered 2019-07-23: 100 ug via INTRAVENOUS
  Administered 2019-07-23: 150 ug via INTRAVENOUS
  Administered 2019-07-23 (×2): 100 ug via INTRAVENOUS

## 2019-07-23 MED ORDER — IBUPROFEN 800 MG PO TABS: 800.0000 mg | ORAL_TABLET | Freq: Four times a day (QID) | ORAL | Status: DC

## 2019-07-23 MED ORDER — MORPHINE SULFATE (PF) 0.5 MG/ML IJ SOLN
INTRAMUSCULAR | Status: AC
  Filled 2019-07-23: qty 10

## 2019-07-23 MED ORDER — DIBUCAINE (PERIANAL) 1 % EX OINT: 1.0000 "application " | TOPICAL_OINTMENT | CUTANEOUS | Status: DC | PRN

## 2019-07-23 MED ORDER — COCONUT OIL OIL
1.0000 "application " | TOPICAL_OIL | Status: DC | PRN
  Filled 2019-07-23: qty 120

## 2019-07-23 MED ORDER — TETANUS-DIPHTH-ACELL PERTUSSIS 5-2.5-18.5 LF-MCG/0.5 IM SUSP: 0.5000 mL | Freq: Once | INTRAMUSCULAR | Status: DC

## 2019-07-23 MED ORDER — ONDANSETRON HCL 4 MG/2ML IJ SOLN
4.0000 mg | INTRAMUSCULAR | Status: DC | PRN
  Administered 2019-07-23: 4 mg via INTRAVENOUS
  Filled 2019-07-23: qty 2

## 2019-07-23 MED ORDER — LIDOCAINE HCL (PF) 1 % IJ SOLN
INTRAMUSCULAR | Status: DC | PRN
  Administered 2019-07-23: 3 mL

## 2019-07-23 MED ORDER — MORPHINE SULFATE (PF) 0.5 MG/ML IJ SOLN
INTRAMUSCULAR | Status: DC | PRN
  Administered 2019-07-23: 3 mg via EPIDURAL

## 2019-07-23 MED ORDER — HYDRALAZINE HCL 20 MG/ML IJ SOLN: 10.0000 mg | INTRAMUSCULAR | Status: DC | PRN

## 2019-07-23 MED ORDER — LIDOCAINE HCL (PF) 2 % IJ SOLN
INTRAMUSCULAR | Status: AC
  Filled 2019-07-23: qty 15

## 2019-07-23 MED ORDER — LACTATED RINGERS IV SOLN: 500.0000 mL | Freq: Once | INTRAVENOUS | Status: DC

## 2019-07-23 MED ORDER — ONDANSETRON HCL 4 MG/2ML IJ SOLN
INTRAMUSCULAR | Status: AC
  Filled 2019-07-23: qty 2

## 2019-07-23 MED ORDER — FENTANYL CITRATE (PF) 100 MCG/2ML IJ SOLN
INTRAMUSCULAR | Status: AC
  Filled 2019-07-23: qty 2

## 2019-07-23 MED ORDER — SODIUM CHLORIDE 0.9 % IV SOLN
2.0000 g | Freq: Four times a day (QID) | INTRAVENOUS | Status: DC
  Administered 2019-07-23: 09:00:00 2 g via INTRAVENOUS
  Filled 2019-07-23: qty 2000

## 2019-07-23 MED ORDER — BUPIVACAINE HCL (PF) 0.5 % IJ SOLN
INTRAMUSCULAR | Status: AC
  Filled 2019-07-23: qty 30

## 2019-07-23 MED ORDER — BUPIVACAINE HCL (PF) 0.5 % IJ SOLN
INTRAMUSCULAR | Status: DC | PRN
  Administered 2019-07-23: 10 mL

## 2019-07-23 MED ORDER — SOD CITRATE-CITRIC ACID 500-334 MG/5ML PO SOLN
30.0000 mL | ORAL | Status: AC
  Administered 2019-07-23: 09:00:00 30 mL via ORAL

## 2019-07-23 MED ORDER — LABETALOL HCL 5 MG/ML IV SOLN: 80.0000 mg | INTRAVENOUS | Status: DC | PRN

## 2019-07-23 MED ORDER — LIDOCAINE HCL (PF) 2 % IJ SOLN
INTRAMUSCULAR | Status: DC | PRN
  Administered 2019-07-23: 40 mg via EPIDURAL
  Administered 2019-07-23: 100 mg via EPIDURAL
  Administered 2019-07-23: 60 mg via EPIDURAL
  Administered 2019-07-23 (×2): 100 mg via EPIDURAL

## 2019-07-23 MED ORDER — SODIUM CHLORIDE 0.9 % IV SOLN
INTRAVENOUS | Status: DC | PRN
  Administered 2019-07-23: 10 mL via EPIDURAL

## 2019-07-23 MED ORDER — BUPIVACAINE ON-Q PAIN PUMP (FOR ORDER SET NO CHG): INJECTION | Status: DC

## 2019-07-23 MED ORDER — SENNOSIDES-DOCUSATE SODIUM 8.6-50 MG PO TABS
2.0000 | ORAL_TABLET | ORAL | Status: DC
  Administered 2019-07-23: 23:00:00 2 via ORAL
  Filled 2019-07-23: qty 2

## 2019-07-23 MED ORDER — SODIUM CHLORIDE 0.9 % IV SOLN
500.0000 mg | INTRAVENOUS | Status: DC
  Administered 2019-07-23: 500 mg via INTRAVENOUS
  Filled 2019-07-23: qty 500

## 2019-07-23 SURGICAL SUPPLY — 26 items
CANISTER SUCT 3000ML PPV (MISCELLANEOUS) ×3 IMPLANT
CATH KIT ON-Q SILVERSOAK 5IN (CATHETERS) ×6 IMPLANT
CHLORAPREP W/TINT 26 (MISCELLANEOUS) ×6 IMPLANT
COVER WAND RF STERILE (DRAPES) ×3 IMPLANT
DERMABOND ADVANCED (GAUZE/BANDAGES/DRESSINGS) ×2
DERMABOND ADVANCED .7 DNX12 (GAUZE/BANDAGES/DRESSINGS) ×1 IMPLANT
DRSG OPSITE POSTOP 4X10 (GAUZE/BANDAGES/DRESSINGS) ×3 IMPLANT
ELECT CAUTERY BLADE 6.4 (BLADE) ×3 IMPLANT
ELECT REM PT RETURN 9FT ADLT (ELECTROSURGICAL) ×3
ELECTRODE REM PT RTRN 9FT ADLT (ELECTROSURGICAL) ×1 IMPLANT
GLOVE SKINSENSE NS SZ8.0 LF (GLOVE) ×2
GLOVE SKINSENSE STRL SZ8.0 LF (GLOVE) ×1 IMPLANT
GOWN STRL REUS W/ TWL LRG LVL3 (GOWN DISPOSABLE) ×1 IMPLANT
GOWN STRL REUS W/ TWL XL LVL3 (GOWN DISPOSABLE) ×2 IMPLANT
GOWN STRL REUS W/TWL LRG LVL3 (GOWN DISPOSABLE) ×2
GOWN STRL REUS W/TWL XL LVL3 (GOWN DISPOSABLE) ×4
NS IRRIG 1000ML POUR BTL (IV SOLUTION) ×3 IMPLANT
PACK C SECTION AR (MISCELLANEOUS) ×3 IMPLANT
PAD OB MATERNITY 4.3X12.25 (PERSONAL CARE ITEMS) ×3 IMPLANT
PAD PREP 24X41 OB/GYN DISP (PERSONAL CARE ITEMS) ×3 IMPLANT
PENCIL SMOKE ULTRAEVAC 22 CON (MISCELLANEOUS) ×3 IMPLANT
SUT MAXON ABS #0 GS21 30IN (SUTURE) ×6 IMPLANT
SUT PLAIN GUT 0 (SUTURE) ×3 IMPLANT
SUT VIC AB 1 CT1 36 (SUTURE) ×12 IMPLANT
SUT VIC AB 2-0 CT1 36 (SUTURE) ×3 IMPLANT
SUT VIC AB 4-0 FS2 27 (SUTURE) ×3 IMPLANT

## 2019-07-23 NOTE — Progress Notes (Signed)
Mom comfortable with epidural placement. Pitocin discontinued.  SVE 4/100/-2 IUPC and FSE placed easily without issue.  NST: 150 bpm baseline, moderate variability, no accelerations, recurrent variable decelerations. Tocometer : every 3-4 minutes Will start amnioinfusion for recurrent variable decelerations  Adelene Idler MD [facility], [facility] 07/23/2019 4:38 AM

## 2019-07-23 NOTE — Progress Notes (Signed)
L&D progress Note   S: Comfortable with epidural  O: General: gravid WF in NAD  Vital Signs: BP (!) 112/58   Pulse 61   Temp 98.6 F (37 C) (Oral)   Resp 16   Ht 5\' 11"  (1.803 m)   Wt 111.6 kg   LMP 10/06/2018 (Exact Date)   SpO2 100%   BMI 34.31 kg/m   FHR: Pitocin restarted at 0730 and was just increased to 6 miu/min; baseline 150? With repetitive late/ variable decelerations  Toco: Contractions 3 in 10 minutes with mvus 140  Cervix: 4/70-80%/-1 to -2  A: FITL/ failed TOL  P: Pitocin discontinued and decelerations stopped Consulted Dr 12/06/2018 regarding fetal heart rate pattern and need for Cesarean section Urgent section called Counseled regarding need for repeat Cesarean section and reviewed risks of bleeding, infection, injury to other abdominal organs and blood vessels, and the risks of anesthesia (also received counseling from Dr Tiburcio Pea last week).  Jean Rosenthal, CNM

## 2019-07-23 NOTE — Anesthesia Preprocedure Evaluation (Deleted)
Anesthesia Evaluation  Patient identified by MRN, date of birth, ID band Patient awake    Reviewed: Allergy & Precautions, H&P , NPO status , Patient's Chart, lab work & pertinent test results  Airway        Dental   Pulmonary asthma , former smoker,           Cardiovascular Exercise Tolerance: Good (-) hypertensionnegative cardio ROS       Neuro/Psych PSYCHIATRIC DISORDERS Depression    GI/Hepatic negative GI ROS,   Endo/Other    Renal/GU   negative genitourinary   Musculoskeletal   Abdominal   Peds  Hematology negative hematology ROS (+)   Anesthesia Other Findings Past Medical History: No date: Asthma No date: Depression affecting pregnancy No date: Obesity (BMI 30-39.9)  Past Surgical History: No date: CESAREAN SECTION  BMI    Body Mass Index: 34.31 kg/m      Reproductive/Obstetrics (+) Pregnancy                             Anesthesia Physical Anesthesia Plan  ASA: III  Anesthesia Plan: Epidural   Post-op Pain Management:    Induction:   PONV Risk Score and Plan:   Airway Management Planned:   Additional Equipment:   Intra-op Plan:   Post-operative Plan:   Informed Consent: I have reviewed the patients History and Physical, chart, labs and discussed the procedure including the risks, benefits and alternatives for the proposed anesthesia with the patient or authorized representative who has indicated his/her understanding and acceptance.       Plan Discussed with: Anesthesiologist  Anesthesia Plan Comments:         Anesthesia Quick Evaluation

## 2019-07-23 NOTE — Discharge Summary (Signed)
OB Discharge Summary     Patient Name: Brandi Solomon DOB: 07-12-1990 MRN: [id number]  Date of admission: 07/22/2019 Delivering MD: Letitia Libra, MD  Date of Delivery: 07/23/2019  Date of discharge: 07/25/2019  Admitting diagnosis: PROM (premature rupture of membranes) [O42.90] History of cesarean delivery [Z98.891] Intrauterine pregnancy: [redacted]w[redacted]d     Secondary diagnosis: Prior cesarean     Discharge diagnosis: Term Pregnancy Delivered and Failed VBAC, Prolonged ROM, Reasons for cesarean section  Non-reassuring FHR and Prior uterine surgery (CS)                         Hospital course:  Onset of Labor With Unplanned C/S  29 y.o. yo [id number] at [redacted]w[redacted]d was admitted in Latent Labor on 07/22/2019. Patient had a labor course significant for Pitocin, Epidiral, Amnioinfusion. Spontaneous Membrane Rupture Time/Date: 8:30 AM ,07/22/2019   The patient went for cesarean section due to Non-Reassuring FHR and prior CS, and delivered a Viable infant,07/23/2019  Details of operation can be found in separate operative note. Patient had an uncomplicated postpartum course.  She is ambulating,tolerating a regular diet, passing flatus, and urinating well.  Patient is discharged home in stable condition 07/25/19.                                                                 Post partum procedures:none  Complications: None  Physical exam on 07/25/2019: Vitals:   07/24/19 1553 07/25/19 0019 07/25/19 0411 07/25/19 0827  BP: 129/83 (!) 119/98  125/82  Pulse: 89 87  75  Resp: 20 18  20   Temp: 98.4 F (36.9 C) 98.4 F (36.9 C) 98.8 F (37.1 C) 98.5 F (36.9 C)  TempSrc: Oral Oral Axillary Oral  SpO2:  100%  99%  Weight:      Height:       General: alert, cooperative and no distress Lochia: appropriate Uterine Fundus: firm/ U-1/ML/NT Incision: Dressing is clean, dry, and intact; ON Q dressing changed DVT Evaluation: No evidence of DVT seen on physical exam.  Labs: Lab Results  Component Value  Date   WBC 7.7 07/24/2019   HGB 10.5 (L) 07/24/2019   HCT 31.6 (L) 07/24/2019   MCV 88.8 07/24/2019   PLT 210 07/24/2019   CMP Latest Ref Rng & Units 07/23/2019  Glucose 70 - 99 mg/dL 87  BUN 6 - 20 mg/dL 8  Creatinine 07/25/2019 - 7.89 mg/dL 3.81  Sodium 0.17 - 510 mmol/L 139  Potassium 3.5 - 5.1 mmol/L 3.9  Chloride 98 - 111 mmol/L 109  CO2 22 - 32 mmol/L 24  Calcium 8.9 - 10.3 mg/dL 8.3(L)  Total Protein 6.5 - 8.1 g/dL 258)  Total Bilirubin 0.3 - 1.2 mg/dL 0.3  Alkaline Phos 38 - 126 U/L 115  AST 15 - 41 U/L 20  ALT 0 - 44 U/L 14    Discharge instruction: per After Visit Summary.  Medications:  Allergies as of 07/25/2019   No Known Allergies     Medication List    STOP taking these medications   dibucaine 1 % Oint Commonly known as: NUPERCAINAL   valACYclovir 500 MG tablet Commonly known as: Valtrex     TAKE these medications   acetaminophen 500 MG tablet Commonly known as:  TYLENOL Take 2 tablets (1,000 mg total) by mouth every 6 (six) hours as needed for mild pain, moderate pain, fever or headache. What changed: reasons to take this   ibuprofen 600 MG tablet Commonly known as: ADVIL Take 1 tablet (600 mg total) by mouth every 6 (six) hours as needed for moderate pain or cramping.   multivitamin-prenatal 27-0.8 MG Tabs tablet Take 2 tablets by mouth daily. Gummie   oxyCODONE 5 MG immediate release tablet Commonly known as: Roxicodone Take 1 tablet (5 mg total) by mouth every 6 (six) hours as needed for up to 3 days for severe pain or breakthrough pain.       Diet: routine diet  Activity: Advance as tolerated. Pelvic rest for 6 weeks.   Outpatient follow up: Follow-up Information    Gae Dry, MD. Schedule an appointment as soon as possible for a visit in 1 week(s).   Specialty: Obstetrics and Gynecology Contact information: [address] [place] [address] [phone]             Postpartum contraception: Nexplanon vs Depo vs  [place] Rhogam Given postpartum: no Rubella vaccine given postpartum: no Varicella vaccine given postpartum: no TDaP given antepartum or postpartum: Yes  Newborn Data: Live born female  Birth Weight: 7 lb 0.9 oz (3200 g) APGAR: 5, 9  Newborn Delivery   Birth date/time: 07/23/2019 10:13:00 Delivery type: C-Section, Low Transverse Trial of labor: Yes C-section categorization: Repeat       Baby Feeding: Breast  Disposition:home with mother  SIGNED:  Dalia Heading, CNM 07/25/2019 11:32 AM

## 2019-07-23 NOTE — Lactation Note (Signed)
This note was copied from a baby's chart. Lactation Consultation Note  Patient Name: Brandi Solomon Today's Date: 07/23/2019 Reason for consult: Follow-up assessment;Mother's request;1st time breastfeeding;Term  Mom concerned that [name] was not waking to feed.  She had her skin to skin when entered room.  Gently moved her to the right breast and hand expressed.  When hand expressed, mom's milk sprayed.  She latched almost immediately and began strong, rhythmic sucking with audible swallows in biological cradle hold skin to skin.  She continued with vigorous sucking with occasional massaging of the breast and touching her chin for 20 minutes on the right breast.  She continued to root after she came off the right breast while mom was trying to burp her because she had spit small amount of mucousy emesis.  Gently moved her to the left breast where she fed for 18 minutes.  Mom denies any breast or nipple discomfort during or after breast feeding.  FOB had lots of pertinent questions about breast feeding which were addressed.  Mom has a 73 year old that she did not breast feed so she had a few appropriate questions as well.  Hand out given about what to expect the first 4 days of life and reviewed.  Explained feeding cues and encouraged mom to put [name] to the breast whenever she demonstrated hunger cues.  Reviewed normal newborn stomach size, adequate out put, supply and demand, normal course of lactation, growth spurts and routine feeding patterns.  Lactation Government social research officer with contact numbers given and reviewed.  Lactation name and number written on white board and encouraged to call with any questions, concerns or assistance. Maternal Data Formula Feeding for Exclusion: No Has patient been taught Hand Expression?: Yes Does the patient have breastfeeding experience prior to this delivery?: No(Did not breast feed 39 year old daughter.)  Feeding Feeding Type: Breast Fed  LATCH  Score Latch: Grasps breast easily, tongue down, lips flanged, rhythmical sucking.  Audible Swallowing: Spontaneous and intermittent  Type of Nipple: Everted at rest and after stimulation  Comfort (Breast/Nipple): Soft / non-tender  Hold (Positioning): Assistance needed to correctly position infant at breast and maintain latch.  LATCH Score: 9  Interventions Interventions: Breast feeding basics reviewed;Assisted with latch;Skin to skin;Breast massage;Hand express;Breast compression;Adjust position;Support pillows;Position options  Lactation Tools Discussed/Used WIC Program: Yes   Consult Status Consult Status: Follow-up Date: 07/23/19 Follow-up type: Call as needed    [place], [place] 07/23/2019, 6:48 PM

## 2019-07-23 NOTE — Anesthesia Procedure Notes (Signed)
Epidural Patient location during procedure: OB Start time: 07/23/2019 3:30 AM End time: 07/23/2019 3:55 AM  Staffing Anesthesiologist: Corinda Gubler, MD Performed: anesthesiologist   Preanesthetic Checklist Completed: patient identified, IV checked, site marked, risks and benefits discussed, surgical consent, monitors and equipment checked, pre-op evaluation and timeout performed  Epidural Patient position: sitting Prep: ChloraPrep and site prepped and draped Patient monitoring: heart rate, continuous pulse ox and blood pressure Approach: midline Location: L3-L4 Injection technique: LOR saline  Needle:  Needle type: Tuohy  Needle gauge: 17 G Needle length: 9 cm and 9 Needle insertion depth: 7 cm Catheter type: closed end flexible Catheter size: 19 Gauge Catheter at skin depth: 12 cm Test dose: negative and 1.5% lidocaine with Epi 1:200 K  Assessment Sensory level: T10 Events: blood not aspirated, injection not painful, no injection resistance, no paresthesia and negative IV test  Additional Notes 1 attempt Pt. Evaluated and documentation done after procedure finished. Patient identified. Risks/Benefits/Options discussed with patient including but not limited to bleeding, infection, nerve damage, paralysis, failed block, incomplete pain control, headache, blood pressure changes, nausea, vomiting, reactions to medication both or allergic, itching and postpartum back pain. Confirmed with bedside nurse the patient's most recent platelet count. Confirmed with patient that they are not currently taking any anticoagulation, have any bleeding history or any family history of bleeding disorders. Patient expressed understanding and wished to proceed. All questions were answered. Sterile technique was used throughout the entire procedure. Please see nursing notes for vital signs. Test dose was given through epidural catheter and negative prior to continuing to dose epidural or start infusion.  Warning signs of high block given to the patient including shortness of breath, tingling/numbness in hands, complete motor block, or any concerning symptoms with instructions to call for help. Patient was given instructions on fall risk and not to get out of bed. All questions and concerns addressed with instructions to call with any issues or inadequate analgesia.   Patient tolerated the insertion well without immediate complications.Reason for block:procedure for pain

## 2019-07-23 NOTE — Op Note (Signed)
Cesarean Section Procedure Note Indications: Prolonged ROM, FITL, failed VBAC, prior cesarean section and term intrauterine pregnancy, [redacted] weeks EGA  Pre-operative Diagnosis: same as indications above Post-operative Diagnosis: same, delivered. Procedure: Low Transverse Cesarean Section Surgeon: Annamarie Major, MD, FACOG Assistant(s): Farrel Conners, CNM, No other capable assistant available, in surgery requiring high level assistant. Anesthesia: Epidural anesthesia Estimated Blood Loss:450 mL Complications: None; patient tolerated the procedure well. Disposition: PACU - hemodynamically stable. Condition: stable  Findings: A female infant in the cephalic presentation. "Naomi" Amniotic fluid - Clear  Birth weight 7-1 lbs.  Apgars of 5 and 9.  Intact placenta with a three-vessel cord. Grossly normal uterus, tubes and ovaries bilaterally.  Min intraabdominal adhesions were noted.  Procedure Details   The patient was taken to Operating Room, identified as the correct patient and the procedure verified as C-Section Delivery. A Time Out was held and the above information confirmed. After induction of anesthesia, the patient was draped and prepped in the usual sterile manner. A Pfannenstiel incision was made and carried down through the subcutaneous tissue to the fascia. Fascial incision was made and extended transversely with the Mayo scissors. The fascia was separated from the underlying rectus tissue superiorly and inferiorly. The peritoneum was identified and entered bluntly. Peritoneal incision was extended longitudinally. The utero-vesical peritoneal reflection was incised transversely and a bladder flap was created digitally.  A low transverse hysterotomy was made. The fetus was delivered atraumatically. The umbilical cord was clamped x2 and cut and the infant was handed to the awaiting pediatricians. The placenta was removed intact and appeared normal with a 3-vessel cord.  The uterus was  exteriorized and cleared of all clot and debris. The hysterotomy was closed with running sutures of 0 Vicryl suture. A second imbricating layer was placed with the same suture. Excellent hemostasis was observed. The uterus was returned to the abdomen. The pelvis was irrigated and again, excellent hemostasis was noted.  The required surgical assistant performed tissue retraction, assistance with suturing, and fundal pressure.   The On Q Pain pump System was then placed.  Trocars were placed through the abdominal wall into the subfascial space and these were used to thread the silver soaker cathaters into place.The rectus fascia was then reapproximated with running sutures of Maxon, with careful placement not to incorporate the cathaters. Subcutaneous tissues are then irrigated with saline and hemostasis assured.  Skin is then closed with 4-0 vicryl suture in a subcuticular fashion followed by skin adhesive. The cathaters are flushed each with 5 mL of Bupivicaine and stabilized into place with dressing. Instrument, sponge, and needle counts were correct prior to the abdominal closure and at the conclusion of the case.  The patient tolerated the procedure well and was transferred to the recovery room in stable condition.   Annamarie Major, MD, [facility], [facility] 07/23/2019  10:50 AM

## 2019-07-23 NOTE — Anesthesia Preprocedure Evaluation (Signed)
Anesthesia Evaluation  Patient identified by MRN, date of birth, ID band Patient awake    Reviewed: Allergy & Precautions, NPO status , Patient's Chart, lab work & pertinent test results  History of Anesthesia Complications Negative for: history of anesthetic complications  Airway Mallampati: III  TM Distance: >3 FB Neck ROM: Full    Dental no notable dental hx. (+) Teeth Intact   Pulmonary asthma , neg sleep apnea, neg COPD, Patient abstained from smoking.Not current smoker, former smoker,    Pulmonary exam normal breath sounds clear to auscultation       Cardiovascular Exercise Tolerance: Good METS(-) hypertension(-) CAD and (-) Past MI negative cardio ROS  (-) dysrhythmias  Rhythm:Regular Rate:Normal - Systolic murmurs    Neuro/Psych PSYCHIATRIC DISORDERS Depression negative neurological ROS     GI/Hepatic neg GERD  ,(+)     (-) substance abuse  ,   Endo/Other  neg diabetes  Renal/GU negative Renal ROS     Musculoskeletal   Abdominal   Peds  Hematology   Anesthesia Other Findings Past Medical History: No date: Asthma No date: Depression affecting pregnancy No date: Obesity (BMI 30-39.9)  Reproductive/Obstetrics                             Anesthesia Physical Anesthesia Plan  ASA: II  Anesthesia Plan: Epidural   Post-op Pain Management:    Induction:   PONV Risk Score and Plan: 3 and Treatment may vary due to age or medical condition and Ondansetron  Airway Management Planned: Natural Airway  Additional Equipment:   Intra-op Plan:   Post-operative Plan:   Informed Consent: I have reviewed the patients History and Physical, chart, labs and discussed the procedure including the risks, benefits and alternatives for the proposed anesthesia with the patient or authorized representative who has indicated his/her understanding and acceptance.       Plan Discussed with:  Surgeon  Anesthesia Plan Comments: (Discussed R/B/A of neuraxial anesthesia technique with patient: - rare risks of spinal/epidural hematoma, nerve damage, infection - Risk of PDPH - Risk of itching - Risk of nausea and vomiting - Risk of poor block necessitating replacement of epidural. Patient voiced understanding.  Patient is a TOLAC. Preop entered into computer after procedure.)        Anesthesia Quick Evaluation

## 2019-07-23 NOTE — Transfer of Care (Signed)
Immediate Anesthesia Transfer of Care Note  Patient: Brandi Solomon  Procedure(s) Performed: CESAREAN SECTION (N/A )  Patient Location: OB LDR 2  Anesthesia Type:Epidural  Level of Consciousness: alert  and patient cooperative  Airway & Oxygen Therapy: Patient Spontanous Breathing  Post-op Assessment: Report given to RN and Post -op Vital signs reviewed and stable  Post vital signs: Reviewed and stable  Last Vitals:  Vitals Value Taken Time  BP    Temp 36.8 C 07/23/19 1103  Pulse    Resp    SpO2      Last Pain:  Vitals:   07/23/19 1103  TempSrc: Oral  PainSc:          Complications: No apparent anesthesia complications

## 2019-07-24 LAB — CBC
HCT: 31.6 % — ABNORMAL LOW (ref 36.0–46.0)
Hemoglobin: 10.5 g/dL — ABNORMAL LOW (ref 12.0–15.0)
MCH: 29.5 pg (ref 26.0–34.0)
MCHC: 33.2 g/dL (ref 30.0–36.0)
MCV: 88.8 fL (ref 80.0–100.0)
Platelets: 210 10*3/uL (ref 150–400)
RBC: 3.56 MIL/uL — ABNORMAL LOW (ref 3.87–5.11)
RDW: 14.3 % (ref 11.5–15.5)
WBC: 7.7 10*3/uL (ref 4.0–10.5)
nRBC: 0 % (ref 0.0–0.2)

## 2019-07-24 MED ORDER — IBUPROFEN 800 MG PO TABS
800.0000 mg | ORAL_TABLET | Freq: Four times a day (QID) | ORAL | Status: DC
  Administered 2019-07-24 – 2019-07-25 (×5): 800 mg via ORAL
  Filled 2019-07-24 (×4): qty 1

## 2019-07-24 MED ORDER — KETOROLAC TROMETHAMINE 30 MG/ML IJ SOLN
30.0000 mg | Freq: Four times a day (QID) | INTRAMUSCULAR | Status: AC
  Administered 2019-07-24: 05:00:00 30 mg via INTRAVENOUS
  Filled 2019-07-24: qty 1

## 2019-07-24 NOTE — Anesthesia Postprocedure Evaluation (Signed)
Anesthesia Post Note  Patient: Clinical research associate  Procedure(s) Performed: CESAREAN SECTION (N/A )  Patient location during evaluation: Mother Baby Anesthesia Type: Epidural Level of consciousness: awake Pain management: pain level controlled Vital Signs Assessment: post-procedure vital signs reviewed and stable Respiratory status: spontaneous breathing and respiratory function stable Cardiovascular status: blood pressure returned to baseline and stable Postop Assessment: no headache, no backache, able to ambulate and no apparent nausea or vomiting Anesthetic complications: no     Last Vitals:  Vitals:   07/24/19 0500 07/24/19 0600  BP:    Pulse:    Resp:    Temp:    SpO2: 99% 100%    Last Pain:  Vitals:   07/24/19 0303  TempSrc: Oral  PainSc:                  Lynden Oxford

## 2019-07-24 NOTE — Lactation Note (Signed)
This note was copied from a baby's chart. Lactation Consultation Note  Patient Name: Brandi Solomon Today's Date: 07/24/2019 Reason for consult: Follow-up assessment  LC in to check on mom and baby. Mom reports being extremely tired; not sleeping in over 24 hours and feels she hasn't gotten the chance with so many people in and out of the room. Baby was breastfeeding, but turned away from mom- and appeared to be just on the nipple. LC questioned about pain or discomfort and mom notes there is some on the opposite side when baby does the same way. LC provided guidance and assistance with turning baby's tummy in towards mom, moving arm to under breast, and ensuring wide open mouth with re-latch. Explained benefits of optimal position/latch to help prevent discomfort or development of pain/discomfort. Encouraged to use coconut oil and expressed colostrum on opposite breast that is tender, and to be mindful of position of baby. Overall mom feels that baby's feedings have gone better today than overnight. LC encouraged rest; and placed the "do not disturb" sign on outside of door after speaking with RN.  Maternal Data Formula Feeding for Exclusion: No Has patient been taught Hand Expression?: Yes Does the patient have breastfeeding experience prior to this delivery?: No  Feeding Feeding Type: Breast Fed  LATCH Score Latch: Grasps breast easily, tongue down, lips flanged, rhythmical sucking.  Audible Swallowing: A few with stimulation  Type of Nipple: Everted at rest and after stimulation  Comfort (Breast/Nipple): Filling, red/small blisters or bruises, mild/mod discomfort(right per mom)  Hold (Positioning): Assistance needed to correctly position infant at breast and maintain latch.  LATCH Score: 7  Interventions Interventions: Breast feeding basics reviewed;Adjust position;Support pillows  Lactation Tools Discussed/Used     Consult Status Consult Status: Follow-up Date:  07/24/19 Follow-up type: In-patient    Danford Bad 07/24/2019, 1:23 PM

## 2019-07-24 NOTE — Progress Notes (Signed)
Subjective:  Doing well.  Tolerating po.  Good pain control.  Minimal lochia.    Objective:  Vital signs in last 24 hours: Temp:  [97.5 F (36.4 C)-98.9 F (37.2 C)] 97.7 F (36.5 C) (02/16 0749) Pulse Rate:  [61-91] 87 (02/16 0749) Resp:  [7-29] 20 (02/16 0749) BP: (55-196)/(26-118) 126/88 (02/16 0749) SpO2:  [96 %-100 %] 99 % (02/16 0749)    Intake/Output      02/15 0701 - 02/16 0700 02/16 0701 - 02/17 0700   P.O. 1000    I.V. (mL/kg) 3322.8 (29.8)    Other 0    IV Piggyback 350    Total Intake(mL/kg) 4672.8 (41.9)    Urine (mL/kg/hr) 1400 (0.5)    Blood 530    Total Output 1930    Net +2742.8           General: NAD Pulmonary: no increased work of breathing Abdomen: non-distended, non-tender, fundus firm at level of umbilicus Incision: D/C/O Extremities: no edema, no erythema, no tenderness  Results for orders placed or performed during the hospital encounter of 07/22/19 (from the past 72 hour(s))  Respiratory Panel by RT PCR (Flu A&B, Covid) - Nasopharyngeal Swab     Status: None   Collection Time: 07/22/19 12:03 PM   Specimen: Nasopharyngeal Swab  Result Value Ref Range   SARS Coronavirus 2 by RT PCR NEGATIVE NEGATIVE    Comment: (NOTE) SARS-CoV-2 target nucleic acids are NOT DETECTED. The SARS-CoV-2 RNA is generally detectable in upper respiratoy specimens during the acute phase of infection. The lowest concentration of SARS-CoV-2 viral copies this assay can detect is 131 copies/mL. A negative result does not preclude SARS-Cov-2 infection and should not be used as the sole basis for treatment or other patient management decisions. A negative result may occur with  improper specimen collection/handling, submission of specimen other than nasopharyngeal swab, presence of viral mutation(s) within the areas targeted by this assay, and inadequate number of viral copies (<131 copies/mL). A negative result must be combined with clinical observations, patient  history, and epidemiological information. The expected result is Negative. Fact Sheet for Patients:  https://www.moore.com/ Fact Sheet for Healthcare Providers:  https://www.young.biz/ This test is not yet ap proved or cleared by the [place] FDA and  has been authorized for detection and/or diagnosis of SARS-CoV-2 by FDA under an Emergency Use Authorization (EUA). This EUA will remain  in effect (meaning this test can be used) for the duration of the COVID-19 declaration under Section 564(b)(1) of the Act, 21 U.S.C. section 360bbb-3(b)(1), unless the authorization is terminated or revoked sooner.    Influenza A by PCR NEGATIVE NEGATIVE   Influenza B by PCR NEGATIVE NEGATIVE    Comment: (NOTE) The Xpert Xpress SARS-CoV-2/FLU/RSV assay is intended as an aid in  the diagnosis of influenza from Nasopharyngeal swab specimens and  should not be used as a sole basis for treatment. Nasal washings and  aspirates are unacceptable for Xpert Xpress SARS-CoV-2/FLU/RSV  testing. Fact Sheet for Patients: https://www.moore.com/ Fact Sheet for Healthcare Providers: https://www.young.biz/ This test is not yet approved or cleared by the [place] FDA and  has been authorized for detection and/or diagnosis of SARS-CoV-2 by  FDA under an Emergency Use Authorization (EUA). This EUA will remain  in effect (meaning this test can be used) for the duration of the  Covid-19 declaration under Section 564(b)(1) of the Act, 21  U.S.C. section 360bbb-3(b)(1), unless the authorization is  terminated or revoked. Performed at [facility],  [place], [place] [address]   CBC     Status: Abnormal   Collection Time: 07/22/19 12:08 PM  Result Value Ref Range   WBC 7.7 4.0 - 10.5 K/uL   RBC 3.82 (L) 3.87 - 5.11 MIL/uL   Hemoglobin 11.1 (L) 12.0 - 15.0 g/dL   HCT 34.0 (L) 36.0 - 46.0 %   MCV 89.0 80.0 -  100.0 fL   MCH 29.1 26.0 - 34.0 pg   MCHC 32.6 30.0 - 36.0 g/dL   RDW 13.9 11.5 - 15.5 %   Platelets 221 150 - 400 K/uL   nRBC 0.0 0.0 - 0.2 %    Comment: Performed at [facility], [address] [place], [place] [address]  Type and screen [place]     Status: None   Collection Time: 07/22/19 12:08 PM  Result Value Ref Range   ABO/RH(D) A POS    Antibody Screen NEG    Sample Expiration      07/25/2019,2359 Performed at [facility], [address] [place], [place] [address]   RPR     Status: None   Collection Time: 07/22/19 12:08 PM  Result Value Ref Range   RPR Ser Ql NON REACTIVE NON REACTIVE    Comment: Performed at [place] [address], [place], [place] [address]  Rapid HIV screen (HIV 1/2 Ab+Ag) ([facility] Only)     Status: None   Collection Time: 07/22/19 12:08 PM  Result Value Ref Range   HIV-1 P24 Antigen - HIV24 NON REACTIVE NON REACTIVE    Comment: (NOTE) Detection of p24 may be inhibited by biotin in the sample, causing false negative results in acute infection.    HIV 1/2 Antibodies NON REACTIVE NON REACTIVE   Interpretation (HIV Ag Ab)      A non reactive test result means that HIV 1 or HIV 2 antibodies and HIV 1 p24 antigen were not detected in the specimen.    Comment: Performed at [facility], [address] [place], [place] [address]  Comprehensive metabolic panel     Status: Abnormal   Collection Time: 07/22/19 12:08 PM  Result Value Ref Range   Sodium 136 135 - 145 mmol/L   Potassium 4.7 3.5 - 5.1 mmol/L   Chloride 106 98 - 111 mmol/L   CO2 24 22 - 32 mmol/L   Glucose, Bld 72 70 - 99 mg/dL   BUN 9 6 - 20 mg/dL   Creatinine, Ser 0.45 0.44 - 1.00 mg/dL   Calcium 8.5 (L) 8.9 - 10.3 mg/dL   Total Protein 5.7 (L) 6.5 - 8.1 g/dL   Albumin 2.6 (L) 3.5 - 5.0 g/dL   AST 15 15 - 41 U/L   ALT 14 0 - 44 U/L   Alkaline Phosphatase 128 (H) 38 - 126 U/L   Total Bilirubin 0.4 0.3 - 1.2 mg/dL   GFR  calc non Af Amer >60 >60 mL/min   GFR calc Af Amer >60 >60 mL/min   Anion gap 6 5 - 15    Comment: Performed at [facility], [address] [place], [place] [address]  Protein / creatinine ratio, urine     Status: None   Collection Time: 07/22/19 12:41 PM  Result Value Ref Range   Creatinine, Urine 117 mg/dL   Total Protein, Urine 18 mg/dL    Comment: NO NORMAL RANGE ESTABLISHED FOR THIS TEST   Protein Creatinine Ratio 0.15 0.00 -  0.15 mg/mg[Cre]    Comment: Performed at [facility], [address] [place], [place] [address]  Protein / creatinine ratio, urine     Status: Abnormal   Collection Time: 07/23/19 12:10 PM  Result Value Ref Range   Creatinine, Urine 79 mg/dL   Total Protein, Urine 29 mg/dL    Comment: NO NORMAL RANGE ESTABLISHED FOR THIS TEST   Protein Creatinine Ratio 0.37 (H) 0.00 - 0.15 mg/mg[Cre]    Comment: Performed at [facility], [address] [place], [place] [address]  CBC     Status: Abnormal   Collection Time: 07/23/19 12:21 PM  Result Value Ref Range   WBC 10.2 4.0 - 10.5 K/uL   RBC 3.85 (L) 3.87 - 5.11 MIL/uL   Hemoglobin 11.3 (L) 12.0 - 15.0 g/dL   HCT 88.2 (L) 80.0 - 34.9 %   MCV 88.8 80.0 - 100.0 fL   MCH 29.4 26.0 - 34.0 pg   MCHC 33.0 30.0 - 36.0 g/dL   RDW 17.9 15.0 - 56.9 %   Platelets 227 150 - 400 K/uL   nRBC 0.0 0.0 - 0.2 %    Comment: Performed at [facility], [address] [place], [place] [address]  Comprehensive metabolic panel     Status: Abnormal   Collection Time: 07/23/19 12:21 PM  Result Value Ref Range   Sodium 139 135 - 145 mmol/L   Potassium 3.9 3.5 - 5.1 mmol/L   Chloride 109 98 - 111 mmol/L   CO2 24 22 - 32 mmol/L   Glucose, Bld 87 70 - 99 mg/dL   BUN 8 6 - 20 mg/dL   Creatinine, Ser 1.65 0.44 - 1.00 mg/dL   Calcium 8.3 (L) 8.9 - 10.3 mg/dL   Total Protein 5.4 (L) 6.5 - 8.1 g/dL   Albumin 2.4 (L) 3.5 - 5.0 g/dL   AST 20 15 - 41 U/L   ALT 14 0 - 44 U/L   Alkaline  Phosphatase 115 38 - 126 U/L   Total Bilirubin 0.3 0.3 - 1.2 mg/dL   GFR calc non Af Amer >60 >60 mL/min   GFR calc Af Amer >60 >60 mL/min   Anion gap 6 5 - 15    Comment: Performed at [facility], [address] [place], [place] [address]  CBC     Status: Abnormal   Collection Time: 07/24/19  6:29 AM  Result Value Ref Range   WBC 7.7 4.0 - 10.5 K/uL   RBC 3.56 (L) 3.87 - 5.11 MIL/uL   Hemoglobin 10.5 (L) 12.0 - 15.0 g/dL   HCT 27.0 (L) 78.6 - 75.4 %   MCV 88.8 80.0 - 100.0 fL   MCH 29.5 26.0 - 34.0 pg   MCHC 33.2 30.0 - 36.0 g/dL   RDW 49.2 01.0 - 07.1 %   Platelets 210 150 - 400 K/uL   nRBC 0.0 0.0 - 0.2 %    Comment: Performed at [facility], [address], [place], [place] [address]    Immunization History  Administered Date(s) Administered  . Influenza,inj,Quad PF,6+ Mos 03/14/2019  . Tdap 05/24/2019    Assessment:   29 y.o. [id number] postoperativeday # 1 RLTCS  pregnancy #4 Problems (from 10/06/18 to present)    Problem Noted Resolved   Previous cesarean delivery, antepartum condition or complication 03/26/2019 by Jimmey Ralph, MD No   Overview Addendum 05/24/2019  8:40 AM by Natale Milch, MD    Done for breech Pt desires  TOLAC   MFMU calculator 66% success   http://www.heath.com/ Predicted chance of vaginal birth after cesarean:  58.3% 95% confidence interval:  [54.0%, 62.5%]      Depression affecting pregnancy 01/05/2019 by Natale Milch, MD No   Supervision of other normal pregnancy, antepartum 01/05/2019 by Natale Milch, MD No   Overview Addendum 07/08/2019  8:44 AM by Farrel Conners, CNM      [facility] Prenatal Labs  Dating  12 wk [place] Blood type: A/Positive/-- (08/14 1611)   Genetic Screen NIPS: Negative XX Antibody:Negative (08/14 1611)  Anatomic [place] incomplete Rubella: 2.18 (08/14 1611) Varicella: Immune  GTT  28 wk:    116  RPR: Non Reactive  (08/14 1611)   Rhogam  not needed HBsAg: Negative (08/14 1611)   TDaP vaccine  05/24/2019                     HIV: Non Reactive (08/14 1611)   Flu Shot   03/14/2019                             GBS: negative  Contraception  undecided, considering Depo vs Nexplanon/IUD Pap: 01/05/2019 NILM  CBB     CS/VBAC 2010/breech- Desires TOLAC, counseled on risks   Baby Food  Breast- RSB discussed   Support Person                  Plan:  ) Acute blood loss anemia - hemodynamically stable and asymptomatic - po ferrous sulfate  2) Blood Type --/--/A POS (02/14 1208) / Rubella 2.18 (08/14 1611) / Varicella Immune  3) TDAP status up to date  4) Feeding plan breast  5)  Education given regarding options for contraception, as well as compatibility with breast feeding if applicable.  Patient plans on depo vs Nexplanon for contraception  6) Disposition anticipate discharge POD2-3  Vena Austria, MD, [facility], [facility] 07/24/2019, 8:34 AM

## 2019-07-24 NOTE — Progress Notes (Signed)
CSW spoke with bedside nurse prior to meeting with MOB. RN denied any concerns or problems.  CSW assessed MOB at bedside. Introduced CSW role. MOB lives with FOB, Brandi Solomon and one older daughter. MOB reported she receives [facility]. MOB stated she has crib, car seat, and other items needed for her baby when they leave the hospital. MOB reported a history of depression with her first child (9 years ago) which she feels is currently controlled. MOB does not currently take any MH medications or see a counselor/therapist. No current signs or symptoms displayed or reported. Education provided on PPD and SIDS, MOB verbalized understanding. MOB stated she is working on establishing a Optometrist for baby. MOB drives and denied transportation barriers. MOB denied any needs and was encouraged to reach out if needs arise.

## 2019-07-25 ENCOUNTER — Inpatient Hospital Stay: Admission: RE | Admit: 2019-07-25 | Payer: Medicaid Other | Source: Ambulatory Visit

## 2019-07-25 MED ORDER — ACETAMINOPHEN 500 MG PO TABS: 1000.0000 mg | ORAL_TABLET | Freq: Four times a day (QID) | ORAL | 1 refills | Status: DC | PRN

## 2019-07-25 MED ORDER — IBUPROFEN 600 MG PO TABS: 600.0000 mg | ORAL_TABLET | Freq: Four times a day (QID) | ORAL | 1 refills | Status: DC | PRN

## 2019-07-25 MED ORDER — OXYCODONE HCL 5 MG PO TABS: 5.0000 mg | ORAL_TABLET | Freq: Four times a day (QID) | ORAL | 0 refills | Status: AC | PRN

## 2019-07-25 NOTE — Progress Notes (Signed)
Pt called out around 0245 asking for formula. Nurse tech went into room because nurse was in another pt's room.   Nurse tech asked if pt needed help latching and if so the nurse could come in and help. Pt stated she just wanted some formula because infant was hungry.   Nurse went into room and parents (FOB in particular) was irritated that a previous night shift nurse had given him a pacifier for the infant without explaining that we typically do not introduce them until 3-4 weeks old or until breastfeeding is well established. Nurse tried to reassure parents and offered various options for the situation. Tried to explain cluster feeding and infant wanting to feed more frequently is common but parents insisted infant did not want to feed due to "nipple confusion".   Set up mom with a breast pump and explained in detail how to use. Also gave a bottle of formula at their request and explained pros and cons of introducing formula at this stage.   Parents feeling better at the moment about the situation now that we have set mom up with the breast pump and calmed baby down.   Will continue to check in and assess situation.  

## 2019-07-25 NOTE — Progress Notes (Signed)
Pt discharged with infant.  Discharge instructions, prescriptions and follow up appointment given to and reviewed with pt. Pt verbalized understanding. Escorted out by staff. 

## 2019-07-25 NOTE — Lactation Note (Signed)
This note was copied from a baby's chart. Lactation Consultation Note  Patient Name: Brandi Solomon Today's Date: 07/25/2019 Reason for consult: Follow-up assessment;Mother's request;1st time breastfeeding;Term  Parents called out for [facility] support/observe feed before discharge. Baby has been sleeping since last assisted feed this morning. Mom was able to rest "for a minute". Dad changed stool diaper before giving baby to mom for feeding. LC assisted with getting mom into comfortable position, bringing baby to right breast in modified cross-cradle position, and sandwiching the breast tissue. Baby latched easily and began strong rhythmic sucking, bottom lip was tucked initially but easily flanged with slight pull on chin. Parents educated on different positions, cross-cradle and football, flanging top and bottom lip, continued use coconut oil and comfort gels, output expectations for days to come, adequate signs of milk transfer, and outpatient lactation support and services, as well as community resources.  Baby fed well for 20 minutes, unlatched on her own and rested contently on mom's breast with relaxed body language. Praised parents for continued breastfeeding efforts, provided reassurance for practice and improving over days/weeks to come, and guidance for when to seek help/support.   Maternal Data Formula Feeding for Exclusion: No Has patient been taught Hand Expression?: Yes Does the patient have breastfeeding experience prior to this delivery?: No  Feeding Feeding Type: Breast Fed  LATCH Score Latch: Grasps breast easily, tongue down, lips flanged, rhythmical sucking.  Audible Swallowing: A few with stimulation  Type of Nipple: Everted at rest and after stimulation  Comfort (Breast/Nipple): Filling, red/small blisters or bruises, mild/mod discomfort  Hold (Positioning): Assistance needed to correctly position infant at breast and maintain latch.  LATCH Score:  7  Interventions Interventions: Breast feeding basics reviewed;Assisted with latch;DEBP;Coconut oil;Support pillows;Adjust position;Breast compression  Lactation Tools Discussed/Used Tools: Coconut oil   Consult Status Consult Status: Complete Date: 07/25/19 Follow-up type: Call as needed    Danford Bad 07/25/2019, 12:00 PM

## 2019-07-25 NOTE — Progress Notes (Signed)
POD #2 Repeat CS/ failed TOLAC/ FITL Subjective:  Feels well. Occasionally has sharp pains with movement. Has not been taking any narcotic pain medicine, only Tylenol and Motrin. Breast feeding. Wants to be discharged later today. Wants to work with lactation a couple more times. Voiding OK. Tolerating regular diet and passing flatus.    Objective:  Blood pressure 125/82, pulse 75, temperature 98.5 F (36.9 C), temperature source Oral, resp. rate 20, height 5\' 11"  (1.803 m), weight 111.6 kg, last menstrual period 10/06/2018, SpO2 99 %, unknown if currently breastfeeding.  General: WF in NAD Heart: RRR without murmur Pulmonary: no increased work of breathing/ CTAB Abdomen: non-distended, appropriately tender, fundus firm at level of umbilicus-1FB, BS active Incision: Honey comb dressing C+D+I; ON Q dressing saturated with serosanguinous drainage. ON Q dressing changed Extremities: no edema, no erythema, no tenderness  Results for orders placed or performed during the hospital encounter of 07/22/19 (from the past 72 hour(s))  Respiratory Panel by RT PCR (Flu A&B, Covid) - Nasopharyngeal Swab     Status: None   Collection Time: 07/22/19 12:03 PM   Specimen: Nasopharyngeal Swab  Result Value Ref Range   SARS Coronavirus 2 by RT PCR NEGATIVE NEGATIVE    Comment: (NOTE) SARS-CoV-2 target nucleic acids are NOT DETECTED. The SARS-CoV-2 RNA is generally detectable in upper respiratoy specimens during the acute phase of infection. The lowest concentration of SARS-CoV-2 viral copies this assay can detect is 131 copies/mL. A negative result does not preclude SARS-Cov-2 infection and should not be used as the sole basis for treatment or other patient management decisions. A negative result may occur with  improper specimen collection/handling, submission of specimen other than nasopharyngeal swab, presence of viral mutation(s) within the areas targeted by this assay, and inadequate number of viral  copies (<131 copies/mL). A negative result must be combined with clinical observations, patient history, and epidemiological information. The expected result is Negative. Fact Sheet for Patients:  PinkCheek.be Fact Sheet for Healthcare Providers:  GravelBags.it This test is not yet ap proved or cleared by the [place] FDA and  has been authorized for detection and/or diagnosis of SARS-CoV-2 by FDA under an Emergency Use Authorization (EUA). This EUA will remain  in effect (meaning this test can be used) for the duration of the COVID-19 declaration under Section 564(b)(1) of the Act, 21 U.S.C. section 360bbb-3(b)(1), unless the authorization is terminated or revoked sooner.    Influenza A by PCR NEGATIVE NEGATIVE   Influenza B by PCR NEGATIVE NEGATIVE    Comment: (NOTE) The Xpert Xpress SARS-CoV-2/FLU/RSV assay is intended as an aid in  the diagnosis of influenza from Nasopharyngeal swab specimens and  should not be used as a sole basis for treatment. Nasal washings and  aspirates are unacceptable for Xpert Xpress SARS-CoV-2/FLU/RSV  testing. Fact Sheet for Patients: PinkCheek.be Fact Sheet for Healthcare Providers: GravelBags.it This test is not yet approved or cleared by the [place] FDA and  has been authorized for detection and/or diagnosis of SARS-CoV-2 by  FDA under an Emergency Use Authorization (EUA). This EUA will remain  in effect (meaning this test can be used) for the duration of the  Covid-19 declaration under Section 564(b)(1) of the Act, 21  U.S.C. section 360bbb-3(b)(1), unless the authorization is  terminated or revoked. Performed at [facility], [address] [place], [place] [address]   CBC     Status: Abnormal   Collection Time: 07/22/19 12:08 PM  Result Value Ref Range  WBC 7.7 4.0 - 10.5 K/uL   RBC 3.82 (L) 3.87 - 5.11  MIL/uL   Hemoglobin 11.1 (L) 12.0 - 15.0 g/dL   HCT 60.1 (L) 56.1 - 53.7 %   MCV 89.0 80.0 - 100.0 fL   MCH 29.1 26.0 - 34.0 pg   MCHC 32.6 30.0 - 36.0 g/dL   RDW 94.3 27.6 - 14.7 %   Platelets 221 150 - 400 K/uL   nRBC 0.0 0.0 - 0.2 %    Comment: Performed at [facility], [address] [place], [place] [address]  Type and screen [facility]     Status: None   Collection Time: 07/22/19 12:08 PM  Result Value Ref Range   ABO/RH(D) A POS    Antibody Screen NEG    Sample Expiration      07/25/2019,2359 Performed at [facility], [address] [place], [place] [address]   RPR     Status: None   Collection Time: 07/22/19 12:08 PM  Result Value Ref Range   RPR Ser Ql NON REACTIVE NON REACTIVE    Comment: Performed at [facility], [address] [place], [place] [address]  Rapid HIV screen (HIV 1/2 Ab+Ag) ([facility] Only)     Status: None   Collection Time: 07/22/19 12:08 PM  Result Value Ref Range   HIV-1 P24 Antigen - HIV24 NON REACTIVE NON REACTIVE    Comment: (NOTE) Detection of p24 may be inhibited by biotin in the sample, causing false negative results in acute infection.    HIV 1/2 Antibodies NON REACTIVE NON REACTIVE   Interpretation (HIV Ag Ab)      A non reactive test result means that HIV 1 or HIV 2 antibodies and HIV 1 p24 antigen were not detected in the specimen.    Comment: Performed at [facility], [address] [place], [place] [address]  Comprehensive metabolic panel     Status: Abnormal   Collection Time: 07/22/19 12:08 PM  Result Value Ref Range   Sodium 136 135 - 145 mmol/L   Potassium 4.7 3.5 - 5.1 mmol/L   Chloride 106 98 - 111 mmol/L   CO2 24 22 - 32 mmol/L   Glucose, Bld 72 70 - 99 mg/dL   BUN 9 6 - 20 mg/dL   Creatinine, Ser 8.18 0.44 - 1.00 mg/dL   Calcium 8.5 (L) 8.9 - 10.3 mg/dL   Total Protein 5.7 (L) 6.5 - 8.1 g/dL   Albumin 2.6 (L) 3.5 - 5.0 g/dL   AST 15 15 - 41 U/L   ALT 14 0 -  44 U/L   Alkaline Phosphatase 128 (H) 38 - 126 U/L   Total Bilirubin 0.4 0.3 - 1.2 mg/dL   GFR calc non Af Amer >60 >60 mL/min   GFR calc Af Amer >60 >60 mL/min   Anion gap 6 5 - 15    Comment: Performed at [facility], [address], [place], [place] [address]  Protein / creatinine ratio, urine     Status: None   Collection Time: 07/22/19 12:41 PM  Result Value Ref Range   Creatinine, Urine 117 mg/dL   Total Protein, Urine 18 mg/dL    Comment: NO NORMAL RANGE ESTABLISHED FOR THIS TEST   Protein Creatinine Ratio 0.15 0.00 - 0.15 mg/mg[Cre]    Comment: Performed at [facility], [address] [place], [place] [address]  Protein / creatinine ratio, urine     Status: Abnormal  Collection Time: 07/23/19 12:10 PM  Result Value Ref Range   Creatinine, Urine 79 mg/dL   Total Protein, Urine 29 mg/dL    Comment: NO NORMAL RANGE ESTABLISHED FOR THIS TEST   Protein Creatinine Ratio 0.37 (H) 0.00 - 0.15 mg/mg[Cre]    Comment: Performed at [facility], [address] [place], [place] [address]  CBC     Status: Abnormal   Collection Time: 07/23/19 12:21 PM  Result Value Ref Range   WBC 10.2 4.0 - 10.5 K/uL   RBC 3.85 (L) 3.87 - 5.11 MIL/uL   Hemoglobin 11.3 (L) 12.0 - 15.0 g/dL   HCT 19.1 (L) 47.8 - 29.5 %   MCV 88.8 80.0 - 100.0 fL   MCH 29.4 26.0 - 34.0 pg   MCHC 33.0 30.0 - 36.0 g/dL   RDW 62.1 30.8 - 65.7 %   Platelets 227 150 - 400 K/uL   nRBC 0.0 0.0 - 0.2 %    Comment: Performed at [facility], [address] [place], [place] [address]  Comprehensive metabolic panel     Status: Abnormal   Collection Time: 07/23/19 12:21 PM  Result Value Ref Range   Sodium 139 135 - 145 mmol/L   Potassium 3.9 3.5 - 5.1 mmol/L   Chloride 109 98 - 111 mmol/L   CO2 24 22 - 32 mmol/L   Glucose, Bld 87 70 - 99 mg/dL   BUN 8 6 - 20 mg/dL   Creatinine, Ser 2.95 0.44 - 1.00 mg/dL   Calcium 8.3 (L) 8.9 - 10.3 mg/dL   Total Protein 5.4 (L) 6.5 - 8.1 g/dL    Albumin 2.4 (L) 3.5 - 5.0 g/dL   AST 20 15 - 41 U/L   ALT 14 0 - 44 U/L   Alkaline Phosphatase 115 38 - 126 U/L   Total Bilirubin 0.3 0.3 - 1.2 mg/dL   GFR calc non Af Amer >60 >60 mL/min   GFR calc Af Amer >60 >60 mL/min   Anion gap 6 5 - 15    Comment: Performed at [facility], [address] [place], [place] [address]  CBC     Status: Abnormal   Collection Time: 07/24/19  6:29 AM  Result Value Ref Range   WBC 7.7 4.0 - 10.5 K/uL   RBC 3.56 (L) 3.87 - 5.11 MIL/uL   Hemoglobin 10.5 (L) 12.0 - 15.0 g/dL   HCT 24.4 (L) 01.0 - 27.2 %   MCV 88.8 80.0 - 100.0 fL   MCH 29.5 26.0 - 34.0 pg   MCHC 33.2 30.0 - 36.0 g/dL   RDW 53.6 64.4 - 03.4 %   Platelets 210 150 - 400 K/uL   nRBC 0.0 0.0 - 0.2 %    Comment: Performed at [facility], [address] [place], [place] [address]     Assessment:   30 y.o. [id number] postoperativeday # 2-stable  Discharge home with instructions Gestational hypertension-majority of pp blood pressures normal.    Plan:  1)Mild anemia - hemodynamically stable and asymptomatic - po vitamins with iron  2) A POS/ RI/ VI  3) TDAP UTD  4) Breast  5) Contraception: discussed pros and cons of Nexplanon, Depo and minipill  6) Disposition-home later today  Farrel Conners, CNM

## 2019-07-25 NOTE — Lactation Note (Signed)
This note was copied from a baby's chart. Lactation Consultation Note  Patient Name: Brandi Solomon Today's Date: 07/25/2019 Reason for consult: Follow-up assessment;Mother's request;Difficult latch;1st time breastfeeding;Term  Dad called out this AM for breastfeeding support. Parent reports difficult feeding night, baby feeding most of the night, mom in pain/discomfort with latching, and pacifier given without education of interference.  Baby was attempting to latch but having difficulty sustaining. LC did note baby's position and blankets. LC assisted with removing blankets, turning baby tummy to tummy and nose to nipple, educating parents about importance of position and wide mouth/deep latch to prevent pain or discomfort. Baby re-latched and grasped breast easily, LC and mom worked to keep baby active and feeding for 10 minutes. Baby self detached, and fell asleep content. LC provided mom with comfort gels and coconut oil, educating on alternating use, healing properties. Encouraged use of DEBP in room post feeds to help bring in mature milk. Encouraged mom to rest now that baby was resting and dad agreed to stay awake and alert.  Parents would like to work on feedings today before going home to ensure everything is ok.  Maternal Data Formula Feeding for Exclusion: No Has patient been taught Hand Expression?: Yes Does the patient have breastfeeding experience prior to this delivery?: No  Feeding Feeding Type: Breast Fed  LATCH Score Latch: Grasps breast easily, tongue down, lips flanged, rhythmical sucking.  Audible Swallowing: A few with stimulation  Type of Nipple: Everted at rest and after stimulation  Comfort (Breast/Nipple): Filling, red/small blisters or bruises, mild/mod discomfort(per mom )  Hold (Positioning): Assistance needed to correctly position infant at breast and maintain latch.  LATCH Score: 7  Interventions Interventions: Assisted with latch;Breast feeding  basics reviewed;Breast massage;Hand express;Breast compression;Adjust position;Comfort gels;Coconut oil  Lactation Tools Discussed/Used Tools: Coconut oil;Comfort gels   Consult Status Consult Status: Follow-up Date: 07/25/19 Follow-up type: In-patient    Danford Bad 07/25/2019, 10:13 AM

## 2019-07-25 NOTE — Discharge Instructions (Signed)
Breastfeeding Tips for a Good Latch Latching is how your baby's mouth attaches to your nipple to breastfeed. It is an important part of breastfeeding. Your baby may have trouble latching for a number of reasons. A poor latch may cause you to have cracked or sore nipples or other problems. Follow these instructions at home: How to position your baby  Find a comfortable place to sit or lie down. Your neck and back should be well supported.  If you are seated, place a pillow or rolled-up blanket under your baby. This will bring him or her to the level of your breast.  Make sure that your baby's belly (abdomen) is facing your belly.  Try different positions to find one that works best for you and your baby. How to help your baby latch   To start, gently rub your breast. Move your fingertips in a circle as you massage from your chest wall toward your nipple. This helps milk flow. Keep doing this during feeding if needed.  Position your breast. Hold your breast with four fingers underneath and your thumb above your nipple. Keep your fingers away from your nipple and your baby's mouth. Follow these steps to help your baby latch: 1. Rub your baby's lips gently with your finger or nipple. 2. When your baby's mouth is open wide enough, quickly bring your baby to your breast and place your whole nipple into your baby's mouth. Place as much of the colored area around your nipple (areola)as possible into your baby's mouth. 3. Your baby's tongue should be between his or her lower gum and your breast. 4. You should be able to see more areola above your baby's upper lip than below the lower lip. 5. When your baby starts sucking, you will feel a gentle pull on your nipple. You should not feel any pain. Be patient. It is common for a baby to suck for about 2-3 minutes to start the flow of breast milk. 6. Make sure that your baby's mouth is in the right position around your nipple. Your baby's lips should make  a seal on your breast and be turned outward.  General instructions  Look for these signs that your baby has latched on to your nipple: ? The baby is quietly tugging or sucking without causing you pain. ? You hear the baby swallow after every 3 or 4 sucks. ? You see movement above and in front of the baby's ears while he or she is sucking.  Be aware of these signs that your baby has not latched on to your nipple: ? The baby makes sucking sounds or smacking sounds while feeding. ? You have nipple pain.  If your baby is not latched well, put your little finger between your baby's gums and your nipple. This will break the seal. Then try to help your baby latch again.  If you keep having problems, get help from a breastfeeding specialist (lactation consultant). Contact a doctor if:  You have cracking or soreness in your nipples that lasts longer than 1 week.  You have nipple pain.  Your breasts are filled with too much milk (engorgement), and this does not improve after 48-72 hours.  You have a plugged milk duct and a fever.  You follow the tips for a good latch but you keep having problems or concerns.  You have a pus-like fluid coming from your breast.  Your baby is not gaining weight.  Your baby loses weight. Summary  Latching is how your   baby's mouth attaches to your nipple to breastfeed.  Try different positions for breastfeeding to find one that works best for you and your baby.  A poor latch may cause you to have cracked or sore nipples or other problems. This information is not intended to replace advice given to you by your health care provider. Make sure you discuss any questions you have with your health care provider. Document Revised: 09/13/2018 Document Reviewed: 12/29/2016 Elsevier Patient Education  [employer]. Discharge Instructions:   Follow-up Appointment: Schedule your appointment ASAP for a follow-up visit in 1 week with Dr. Kenton Kingfisher!   If there  are any new medications, they have been ordered and will be available for pickup at the listed pharmacy on your way home from the hospital.   Call office if you have any of the following: headache, visual changes, fever >101.0 F, chills, shortness of breath, breast concerns, excessive vaginal bleeding, incision drainage or problems, leg pain or redness, depression or any other concerns. If you have vaginal discharge with an odor, let your doctor know.   It is normal to bleed for up to 6 weeks. You should not soak through more than 1 pad in 1 hour. If you have a blood clot larger than your fist with continued bleeding, call your doctor.   After a c-section, you should expect a small amount of blood or clear fluid coming from the incision and abdominal cramping/soreness. Inspect your incision site daily. Stand in front of a mirror to look for any redness, incision opening, or discolored/odorness drainage. Take a shower daily and continue good hygiene. Use own towel and washcloth (do not share). Make sure your sheets on your bed are clean. No pets sleeping around your incision site. Dressing will be removed at your postpartum visit. If the dressing does become wet or soiled underneath, it is okay to remove it.   On-Q pump: You will remove on day 5 after insertion or if the ball becomes flat before day 5. You will remove on: February 20th  Activity: Do not lift > 10 lbs for 6 weeks (do not lift anything heavier than your baby). No intercourse, tampons, swimming pools, hot tubs, baths (only showers) for 6 weeks.  No driving for 1-2 weeks. Continue prenatal vitamin, especially if breastfeeding. Increase calories and fluids (water) while breastfeeding.   Your milk will come in, in the next couple of days (right now it is colostrum). You may have a slight fever when your milk comes in, but it should go away on its own.  If it does not, and rises above 101 F please call the doctor. You will also feel achy and  your breasts will be firm. They will also start to leak. If you are breastfeeding, continue as you have been and you can pump/express milk for comfort.   If you have too much milk, your breasts can become engorged, which could lead to mastitis. This is an infection of the milk ducts. It can be very painful and you will need to notify your doctor to obtain a prescription for antibiotics. You can also treat it with a shower or hot/cold compress.   For concerns about your baby, please call your pediatrician.  For breastfeeding concerns, the lactation consultant can be reached at [phone].   Postpartum blues (feelings of happy one minute and sad another minute) are normal for the first few weeks but if it gets worse let your doctor know.   Congratulations! We enjoyed caring  for you and your new bundle of joy!      Cesarean Delivery, Care After This sheet gives you information about how to care for yourself after your procedure. Your health care provider may also give you more specific instructions. If you have problems or questions, contact your health care provider. What can I expect after the procedure? After the procedure, it is common to have:  A small amount of blood or clear fluid coming from the incision.  Some redness, swelling, and pain in your incision area.  Some abdominal pain and soreness.  Vaginal bleeding (lochia). Even though you did not have a vaginal delivery, you will still have vaginal bleeding and discharge.  Pelvic cramps.  Fatigue. You may have pain, swelling, and discomfort in the tissue between your vagina and your anus (perineum) if:  Your C-section was unplanned, and you were allowed to labor and push.  An incision was made in the area (episiotomy) or the tissue tore during attempted vaginal delivery. Follow these instructions at home: Incision care   Follow instructions from your health care provider about how to take care of your incision. Make sure  you: ? Wash your hands with soap and water before you change your bandage (dressing). If soap and water are not available, use hand sanitizer. ? If you have a dressing, change it or remove it as told by your health care provider. ? Leave stitches (sutures), skin staples, skin glue, or adhesive strips in place. These skin closures may need to stay in place for 2 weeks or longer. If adhesive strip edges start to loosen and curl up, you may trim the loose edges. Do not remove adhesive strips completely unless your health care provider tells you to do that.  Check your incision area every day for signs of infection. Check for: ? More redness, swelling, or pain. ? More fluid or blood. ? Warmth. ? Pus or a bad smell.  Do not take baths, swim, or use a hot tub until your health care provider says it's okay. Ask your health care provider if you can take showers.  When you cough or sneeze, hug a pillow. This helps with pain and decreases the chance of your incision opening up (dehiscing). Do this until your incision heals. Medicines  Take over-the-counter and prescription medicines only as told by your health care provider.  If you were prescribed an antibiotic medicine, take it as told by your health care provider. Do not stop taking the antibiotic even if you start to feel better.  Do not drive or use heavy machinery while taking prescription pain medicine. Lifestyle  Do not drink alcohol. This is especially important if you are breastfeeding or taking pain medicine.  Do not use any products that contain nicotine or tobacco, such as cigarettes, e-cigarettes, and chewing tobacco. If you need help quitting, ask your health care provider. Eating and drinking  Drink at least 8 eight-ounce glasses of water every day unless told not to by your health care provider. If you breastfeed, you may need to drink even more water.  Eat high-fiber foods every day. These foods may help prevent or relieve  constipation. High-fiber foods include: ? Whole grain cereals and breads. ? Brown rice. ? Beans. ? Fresh fruits and vegetables. Activity   If possible, have someone help you care for your baby and help with household activities for at least a few days after you leave the hospital.  Return to your normal activities as told by  your health care provider. Ask your health care provider what activities are safe for you.  Rest as much as possible. Try to rest or take a nap while your baby is sleeping.  Do not lift anything that is heavier than 10 lbs (4.5 kg), or the limit that you were told, until your health care provider says that it is safe.  Talk with your health care provider about when you can engage in sexual activity. This may depend on your: ? Risk of infection. ? How fast you heal. ? Comfort and desire to engage in sexual activity. General instructions  Do not use tampons or douches until your health care provider approves.  Wear loose, comfortable clothing and a supportive and well-fitting bra.  Keep your perineum clean and dry. Wipe from front to back when you use the toilet.  If you pass a blood clot, save it and call your health care provider to discuss. Do not flush blood clots down the toilet before you get instructions from your health care provider.  Keep all follow-up visits for you and your baby as told by your health care provider. This is important. Contact a health care provider if:  You have: ? A fever. ? Bad-smelling vaginal discharge. ? Pus or a bad smell coming from your incision. ? Difficulty or pain when urinating. ? A sudden increase or decrease in the frequency of your bowel movements. ? More redness, swelling, or pain around your incision. ? More fluid or blood coming from your incision. ? A rash. ? Nausea. ? Little or no interest in activities you used to enjoy. ? Questions about caring for yourself or your baby.  Your incision feels warm to  the touch.  Your breasts turn red or become painful or hard.  You feel unusually sad or worried.  You vomit.  You pass a blood clot from your vagina.  You urinate more than usual.  You are dizzy or light-headed. Get help right away if:  You have: ? Pain that does not go away or get better with medicine. ? Chest pain. ? Difficulty breathing. ? Blurred vision or spots in your vision. ? Thoughts about hurting yourself or your baby. ? New pain in your abdomen or in one of your legs. ? A severe headache.  You faint.  You bleed from your vagina so much that you fill more than one sanitary pad in one hour. Bleeding should not be heavier than your heaviest period. Summary  After the procedure, it is common to have pain at your incision site, abdominal cramping, and slight bleeding from your vagina.  Check your incision area every day for signs of infection.  Tell your health care provider about any unusual symptoms.  Keep all follow-up visits for you and your baby as told by your health care provider. This information is not intended to replace advice given to you by your health care provider. Make sure you discuss any questions you have with your health care provider. Document Revised: 11/30/2017 Document Reviewed: 11/30/2017 Elsevier Patient Education  2020 [employer].

## 2019-07-26 ENCOUNTER — Encounter: Payer: Medicaid Other | Admitting: Certified Nurse Midwife

## 2019-07-30 ENCOUNTER — Other Ambulatory Visit: Payer: Medicaid Other

## 2019-07-31 ENCOUNTER — Inpatient Hospital Stay: Admit: 2019-07-31 | Payer: Medicaid Other | Admitting: Obstetrics and Gynecology

## 2019-07-31 SURGERY — Surgical Case
Anesthesia: Choice

## 2019-08-03 ENCOUNTER — Encounter: Payer: Self-pay | Admitting: Obstetrics & Gynecology

## 2019-08-03 ENCOUNTER — Other Ambulatory Visit: Payer: Self-pay

## 2019-08-03 ENCOUNTER — Ambulatory Visit (INDEPENDENT_AMBULATORY_CARE_PROVIDER_SITE_OTHER): Payer: Medicaid Other | Admitting: Obstetrics & Gynecology

## 2019-08-03 VITALS — BP 140/90 | Ht 71.0 in | Wt 231.0 lb

## 2019-08-03 DIAGNOSIS — Z98891 History of uterine scar from previous surgery: Secondary | ICD-10-CM

## 2019-08-03 NOTE — Progress Notes (Signed)
  Postoperative Follow-up Patient presents post op from recent Cesarean Section performed for failed VBAC, 2 weeks ago.   Subjective: Patient reports marked improvement in her immediate post op symptoms. Eating a regular diet without difficulty. Pain is controlled with current analgesics. Medications being used: ibuprofen (OTC).  Activity: normal activities of daily living. Patient reports additional symptom's since surgery of appropriate lochia, no signs of depression, and no signs of mastitis.  Objective: BP 140/90   Ht 5\' 11"  (1.803 m)   Wt 231 lb (104.8 kg)   BMI 32.22 kg/m  Physical Exam Constitutional:      General: She is not in acute distress.    Appearance: She is well-developed.  Cardiovascular:     Rate and Rhythm: Normal rate.  Pulmonary:     Effort: Pulmonary effort is normal.  Abdominal:     General: There is no distension.     Palpations: Abdomen is soft.     Tenderness: There is no abdominal tenderness.     Comments: Incision Healing Well   Musculoskeletal:        General: Normal range of motion.  Neurological:     Mental Status: She is alert and oriented to person, place, and time.     Cranial Nerves: No cranial nerve deficit.  Skin:    General: Skin is warm and dry.     Assessment: s/p : Cesarean Section for failed VBAC progressing well  Plan: Patient has done well after her Cesarean Section with no apparent complications.  I have discussed the post-operative course to date, and the expected progress moving forward.  The patient understands what complications to be concerned about.  I will see the patient in routine follow up, or sooner if needed.    Activity plan: No heavy lifting.  Pelvic rest. She desires eother Depo or Mirena for postpartum contraception. Breast and Bottle feeding No s/sx PPD  Marland Kitchen 08/03/2019, 3:13 PM

## 2019-08-31 ENCOUNTER — Ambulatory Visit (INDEPENDENT_AMBULATORY_CARE_PROVIDER_SITE_OTHER): Payer: Medicaid Other | Admitting: Certified Nurse Midwife

## 2019-08-31 ENCOUNTER — Ambulatory Visit (INDEPENDENT_AMBULATORY_CARE_PROVIDER_SITE_OTHER): Payer: Medicaid Other

## 2019-08-31 ENCOUNTER — Other Ambulatory Visit: Payer: Self-pay

## 2019-08-31 ENCOUNTER — Encounter: Payer: Self-pay | Admitting: Certified Nurse Midwife

## 2019-08-31 DIAGNOSIS — Z3042 Encounter for surveillance of injectable contraceptive: Secondary | ICD-10-CM | POA: Diagnosis not present

## 2019-08-31 DIAGNOSIS — F53 Postpartum depression: Secondary | ICD-10-CM | POA: Diagnosis not present

## 2019-08-31 DIAGNOSIS — G5722 Lesion of femoral nerve, left lower limb: Secondary | ICD-10-CM

## 2019-08-31 DIAGNOSIS — F329 Major depressive disorder, single episode, unspecified: Secondary | ICD-10-CM

## 2019-08-31 DIAGNOSIS — F32A Depression, unspecified: Secondary | ICD-10-CM

## 2019-08-31 MED ORDER — MEDROXYPROGESTERONE ACETATE 150 MG/ML IM SUSP: 150.0000 mg | INTRAMUSCULAR | 3 refills | Status: DC

## 2019-08-31 MED ORDER — MEDROXYPROGESTERONE ACETATE 150 MG/ML IM SUSY: 150.0000 mg | PREFILLED_SYRINGE | Freq: Once | INTRAMUSCULAR | 0 refills | Status: DC

## 2019-08-31 MED ORDER — MEDROXYPROGESTERONE ACETATE 150 MG/ML IM SUSP
150.0000 mg | Freq: Once | INTRAMUSCULAR | Status: AC
  Administered 2019-08-31: 150 mg via INTRAMUSCULAR

## 2019-08-31 MED ORDER — SERTRALINE HCL 50 MG PO TABS: 50.0000 mg | ORAL_TABLET | Freq: Every day | ORAL | 1 refills | Status: DC

## 2019-08-31 NOTE — Progress Notes (Signed)
Pt given depo in RUOQ, tolerated well and will return in 12 weeks for depo.

## 2019-08-31 NOTE — Progress Notes (Signed)
Postpartum Visit  Chief Complaint:  Chief Complaint  Patient presents with  . Postpartum Care    with standing has pressure and pulling on left side, sharp pain if stands too long    History of Present Illness: Brandi Solomon is a 29 y.o. [id number] WF who  presents for her 6 week postpartum visit.  Date of delivery: 07/23/2019 Type of delivery: repeat Cesarean section, failed VBAC Episiotomy No.  Laceration: no Pregnancy or labor problems:  Yes, fetal intolerance of labor. Hx of depression Any problems since the delivery:  Yes, worsening depression. In addition she reports pain in her LLQ that shoots into her right thigh with prolonged standing. Her job requires prolonged standing and she does not think she will be able to tolerate working.  Newborn Details:  SINGLETON :  1. 8 name: Delcie Roch. Birth weight: 7#1oz Maternal Details:  Breast Feeding:  no Post partum depression/anxiety noted:  yes Edinburgh Post-Partum Depression Score:  21  Date of last PAP: 01/05/2019  normal   Review of Systems: ROS  Past Medical History:  Past Medical History:  Diagnosis Date  . Asthma   . Depression affecting pregnancy   . Obesity (BMI 30-39.9)   . Previous cesarean delivery, antepartum condition or complication 45/40/9811   Done for breech Pt desires TOLAC   MFMU calculator 66% success   [email or web address] Predicted chance of vaginal birth after cesarean:  58.3% 95% confidence interval:  [54.0%, 62.5%]  . S/P C-section 07/23/2019    Past Surgical History:  Past Surgical History:  Procedure Laterality Date  . CESAREAN SECTION    . CESAREAN SECTION N/A 07/23/2019   Procedure: CESAREAN SECTION;  Surgeon: Gae Dry, MD;  Location: [facility] ORS;  Service: Obstetrics;  Laterality: N/A;    Family History:  Family History  Problem Relation Age of Onset  . Throat cancer Maternal Aunt   . Skin cancer Maternal Aunt   .  Hypertension Maternal Aunt   . Diabetes Mellitus II Maternal Aunt     Social History:  Social History   Socioeconomic History  . Marital status: Single    Spouse name: Not on file  . Number of children: 2  . Years of education: Not on file  . Highest education level: Not on file  Occupational History  . Not on file  Tobacco Use  . Smoking status: Former Research scientist (life sciences)  . Smokeless tobacco: Never Used  . Tobacco comment: Quit since finding out she was pregnant.   Substance and Sexual Activity  . Alcohol use: Not Currently    Comment: Quit since finding out she was pregnant.   . Drug use: No  . Sexual activity: Not Currently    Birth control/protection: Injection  Other Topics Concern  . Not on file  Social History Narrative  . Not on file   Social Determinants of Health   Financial Resource Strain:   . Difficulty of Paying Living Expenses:   Food Insecurity:   . Worried About Charity fundraiser in the Last Year:   . Arboriculturist in the Last Year:   Transportation Needs:   . Film/video editor (Medical):   Marland Kitchen Lack of Transportation (Non-Medical):   Physical Activity:   . Days of Exercise per Week:   . Minutes of Exercise per Session:   Stress:   . Feeling of Stress :   Social Connections:   . Frequency of Communication with Friends and Family:   . Frequency  of Social Gatherings with Friends and Family:   . Attends Religious Services:   . Active Member of Clubs or Organizations:   . Attends Banker Meetings:   Marland Kitchen Marital Status:   Intimate Partner Violence:   . Fear of Current or Ex-Partner:   . Emotionally Abused:   Marland Kitchen Physically Abused:   . Sexually Abused:     Allergies:  No Known Allergies  Medications: Prior to Admission medications   Medication Sig Start Date End Date Taking? Authorizing Provider  ibuprofen (ADVIL) 600 MG tablet Take 1 tablet (600 mg total) by mouth every 6 (six) hours as needed for moderate pain or cramping. 07/25/19  Yes  Farrel Conners, CNM  Prenatal Vit-Fe Fumarate-FA (MULTIVITAMIN-PRENATAL) 27-0.8 MG TABS tablet Take 2 tablets by mouth daily. Gummie   Yes [provider]  acetaminophen (TYLENOL) 500 MG tablet Take 2 tablets (1,000 mg total) by mouth every 6 (six) hours as needed for mild pain, moderate pain, fever or headache. Patient not taking: Reported on 08/31/2019 07/25/19   Farrel Conners, CNM  Physical Exam Vitals: BP 128/90   Pulse 66   Ht 5\' 11"  (1.803 m)   Wt 227 lb (103 kg)   LMP  (LMP Unknown)   Breastfeeding No   BMI 31.66 kg/m  General: WF in NAD HEENT: normocephalic, anicteric Neck: No thyroid enlargement, no palpable nodules, no cervical lymphadenpathy Breast: soft, no inflammation, no masses, nipples intact Heart: RRR without murmur Pulmonary: No increased work of breathing, CTAB Abdomen: Soft, non-tender, non-distended.  Well healed CS scar. Umbilicus without lesions.  No hepatomegaly or masses palpable. No evidence of hernia. Genitourinary:  External: Well healed perineum, no lesions or inflammation    Vagina: Normal vaginal mucosa, no evidence of prolapse.    Cervix: closed, non tender, no bleeding  Uterus: Well involuted, mobile, non-tender  Adnexa: No adnexal masses, non-tender  Rectal: deferred Extremities: no edema, erythema, or tenderness Neurologic: Grossly intact Psychiatric: mood appropriate, affect full  Assessment: 29 y.o. 26 presenting for 6 week postpartum visit Depression Pain in LLQ radiating to left thigh-possibly due to nerve injury (discussed with Dr [id number])  Plan:  1) Contraception : Patient would like to use Depo Provera for contraception. Has used in the past. RX sent to [facility] [address]. Will pick up first dose and bring back after lunch for injection. Advised to use back up birth control x 2 weeks. RTN every 12 weeks for Depo  2)  Pap not due  3) Patient underwent screening for postpartum depression with some concerns noted. EPDS  score is 21 with no suicidal ideas. Was treated in the past for depression at age 14. Has never received counseling/therapy. Desires to start taking a antidepressant: Trial of Zoloft 25 mgm x 3 days then increase to 50 mgm daily. Follow up in 2-3 weeks. Discussed possible side effects. Strongly encouraged to let me know if her depression worsens on the medication or if she feels suicidal. Given a list of counselors in the area and the name of the [facility] [employer].  4) Recommended extending her maternity medical leave x 2 weeks as unable to stand for long periods without experiencing pain in LLQ and in left anterior thigh  Education officer, community, CNM

## 2019-09-01 ENCOUNTER — Encounter: Payer: Self-pay | Admitting: Certified Nurse Midwife

## 2019-09-19 ENCOUNTER — Other Ambulatory Visit: Payer: Self-pay

## 2019-09-19 ENCOUNTER — Ambulatory Visit (INDEPENDENT_AMBULATORY_CARE_PROVIDER_SITE_OTHER): Payer: Medicaid Other | Admitting: Certified Nurse Midwife

## 2019-09-19 VITALS — BP 122/74 | Wt 228.0 lb

## 2019-09-19 DIAGNOSIS — F32A Depression, unspecified: Secondary | ICD-10-CM

## 2019-09-19 DIAGNOSIS — F329 Major depressive disorder, single episode, unspecified: Secondary | ICD-10-CM | POA: Diagnosis not present

## 2019-09-23 ENCOUNTER — Encounter: Payer: Self-pay | Admitting: Certified Nurse Midwife

## 2019-09-23 MED ORDER — SERTRALINE HCL 50 MG PO TABS: 50.0000 mg | ORAL_TABLET | Freq: Every day | ORAL | 2 refills | Status: DC

## 2019-09-23 NOTE — Progress Notes (Signed)
  History of Present Illness:  Brandi Solomon is a 29 y.o. who was started on Zoloft at her 6 week postpartum visit approximately 3 weeks ago. Since that time, she states that her symptoms of postpartum depression are improving. She is feeling less irritable and less sad and has not been crying like she was. She has no SI. She also reports that the LLQ pain and left thigh pain have resolved. She will be going back to work.   PMHx: She  has a past medical history of Asthma, Back pain affecting pregnancy in third trimester (06/27/2019), Depression affecting pregnancy, Elevated blood pressure affecting pregnancy, antepartum (01/19/2019), Obesity (BMI 30-39.9), Obesity affecting pregnancy (03/26/2019), Previous cesarean delivery, antepartum condition or complication (03/26/2019), S/P C-section (07/23/2019), and Supervision of other normal pregnancy, antepartum (01/05/2019). Also,  has a past surgical history that includes Cesarean section and Cesarean section (N/A, 07/23/2019)., family history includes Diabetes Mellitus II in her maternal aunt; Hypertension in her maternal aunt; Skin cancer in her maternal aunt; Throat cancer in her maternal aunt.,  reports that she has quit smoking. She has never used smokeless tobacco. She reports previous alcohol use. She reports that she does not use drugs.  She has a current medication list which includes the following prescription(s): acetaminophen, ibuprofen, medroxyprogesterone, medroxyprogesterone acetate, multivitamin-prenatal, and sertraline. Also, has No Known Allergies.  ROS  Physical Exam:  BP 122/74   Wt 228 lb (103.4 kg)   LMP  (LMP Unknown)   BMI 31.80 kg/m  Body mass index is 31.8 kg/m. Constitutional: Well nourished, well developed female in no acute distress. Neuro: Grossly intact Psych:  Normal mood and affect.    EPDS score is 10, down from 21  Assessment: Medication treatment is going well for her postpartum depression LLQ pain radiating to  left thigh has resolved.  Plan: She will undergo no change in her medical therapy. Continue Zoloft 50 mgm daily  She was amenable to this plan and we will see her back in 2 months when her Depo Provera injection is due  Farrel Conners, CNM [facility], [facility]

## 2019-11-23 ENCOUNTER — Ambulatory Visit: Payer: Medicaid Other | Admitting: Certified Nurse Midwife

## 2019-11-28 ENCOUNTER — Other Ambulatory Visit: Payer: Self-pay

## 2019-11-28 ENCOUNTER — Ambulatory Visit (INDEPENDENT_AMBULATORY_CARE_PROVIDER_SITE_OTHER): Payer: Medicaid Other | Admitting: Certified Nurse Midwife

## 2019-11-28 ENCOUNTER — Encounter: Payer: Self-pay | Admitting: Certified Nurse Midwife

## 2019-11-28 VITALS — BP 130/70 | HR 80 | Resp 18 | Ht 70.0 in | Wt 237.4 lb

## 2019-11-28 DIAGNOSIS — F329 Major depressive disorder, single episode, unspecified: Secondary | ICD-10-CM | POA: Diagnosis not present

## 2019-11-28 DIAGNOSIS — F419 Anxiety disorder, unspecified: Secondary | ICD-10-CM

## 2019-11-28 MED ORDER — ESCITALOPRAM OXALATE 10 MG PO TABS: ORAL_TABLET | ORAL | 3 refills | Status: DC

## 2019-11-28 NOTE — Progress Notes (Signed)
  History of Present Illness:  Brandi Solomon is a 29 y.o. who presents for a follow up regarding her postpartum depression and she was also scheduled to get her next Depo Provera injection.  She was started on Zoloft at her 6 week postpartum visit for depression/anxiety (08/31/2019). Her symptoms initially improved on the Zoloft, but about 1.5 months ago, she began to feel like the Zoloft was not working as well. She contemplated increasing the dose from 50 to 100 mgm, but decided to just stop taking the medicine instead. She feels the same off the medicine as she did on the Zoloft 50 mgm. She worries about her baby and feels panicky at times. She is feeling overwhelmed and occasionally cries. She has no SI. She is back at work at [employer].   She did not realize that she had to get her Depo at the pharmacy and bring it with her to the appointment. She would like to continue on Depo Provera.  PMHx: She  has a past medical history of Asthma, Back pain affecting pregnancy in third trimester (06/27/2019), Delivery of pregnancy by cesarean section (07/25/2019), Depression affecting pregnancy, Elevated blood pressure affecting pregnancy, antepartum (01/19/2019), Failed trial of labor, delivered, current hospitalization (07/25/2019), Fetal intolerance to labor, delivered, current hospitalization (07/25/2019), Obesity (BMI 30-39.9), Obesity affecting pregnancy (03/26/2019), Postpartum care following cesarean delivery (07/25/2019), Previous cesarean delivery, antepartum condition or complication (03/26/2019), S/P C-section (07/23/2019), and Supervision of other normal pregnancy, antepartum (01/05/2019). Also,  has a past surgical history that includes Cesarean section and Cesarean section (N/A, 07/23/2019)., family history includes Diabetes Mellitus II in her maternal aunt; Hypertension in her maternal aunt; Skin cancer in her maternal aunt; Throat cancer in her maternal aunt.,  reports that she has quit smoking. She has  never used smokeless tobacco. She reports previous alcohol use. She reports that she does not use drugs.  She has a current medication list which includes the following prescription(s): medroxyprogesterone, multivitamin-prenatal, and sertraline. Also, has No Known Allergies.  ROS  Physical Exam:  BP 130/70   Pulse 80   Resp 18   Ht 5\' 10"  (1.778 m)   Wt 237 lb 6.4 oz (107.7 kg)   SpO2 99%   BMI 34.06 kg/m  Body mass index is 34.06 kg/m. Constitutional: Well nourished, well developed female in no acute distress. Neuro: Grossly intact Psych:  Normal mood and affect.    EPDS score is 11, was 10 at her last visit on the Zoloft (initial score was 21)  Assessment/Plan: DIscussed options for treatment of depression with anxiety including starting back the sertraline and increasing the dose gradually to 100 mgm or trying a different medication like Lexapro/ Effexor/ or Wellbutrin. She would like to try Lexapro. Will start on 5 mgm and increase to 10  Mgm and have her follow up in 3 weeks.  I called in a refill of her Depo Provera. Scheduled to return this week for the Depo injection. Last Depo was given 3/26 (12-13 weeks ago)  4/26, CNM [facility], [facility]

## 2019-11-29 ENCOUNTER — Other Ambulatory Visit: Payer: Self-pay

## 2019-11-29 ENCOUNTER — Ambulatory Visit (INDEPENDENT_AMBULATORY_CARE_PROVIDER_SITE_OTHER): Payer: Medicaid Other

## 2019-11-29 DIAGNOSIS — Z3042 Encounter for surveillance of injectable contraceptive: Secondary | ICD-10-CM | POA: Diagnosis not present

## 2019-11-29 MED ORDER — MEDROXYPROGESTERONE ACETATE 150 MG/ML IM SUSP
150.0000 mg | Freq: Once | INTRAMUSCULAR | Status: AC
  Administered 2019-11-29: 150 mg via INTRAMUSCULAR

## 2019-11-29 NOTE — Progress Notes (Signed)
Patient presents today for Depo Provera injection within dates. Given IM Right Deltoid. Patient tolerated well. 

## 2019-12-19 ENCOUNTER — Ambulatory Visit: Payer: Medicaid Other | Admitting: Certified Nurse Midwife

## 2019-12-27 IMAGING — US US MFM OB DETAIL+14 WK
1 series · 12 of 28 positions shown · non-contrast
Comparison: none

PATIENT INFO:

PERFORMED BY:
                   Sonographer                              MORAAD
SERVICE(S) PROVIDED:
 ----------------------------------------------------------------------
INDICATIONS:
  22 weeks gestation of pregnancy
FETAL EVALUATION:
 Num Of Fetuses:         1
 Preg. Location:         Mid
 Fetal Heart Rate(bpm):  147
 Fetal Lie:              Maternal Right
 Presentation:           Vertex
 Placenta:               Posterior Grade , No previa
 P. Cord Insertion:      Normal
 Amniotic Fluid
 AFI FV:      Within normal limits
                             Largest Pocket(cm)
BIOMETRY:
 BPD:      50.4  mm     G. Age:  21w 2d         13  %    CI:        65.46   %    70 - 86
                                                         FL/HC:      19.7   %    18.4 -
 HC:       200   mm     G. Age:  22w 1d         33  %    HC/AC:      1.12        1.06 -
 AC:       178   mm     G. Age:  22w 5d         55  %    FL/BPD:     78.2   %    71 - 87
 FL:       39.4  mm     G. Age:  22w 5d         53  %    FL/AC:      22.1   %    20 - 24
 HUM:      37.6  mm     G. Age:  23w 1d         70  %
 Est. FW:     515  gm      1 lb 2 oz     55  %
GESTATIONAL AGE:
 Clinical EDD:  22w 2d                                        EDD:   07/28/19
 U/S Today:     22w 2d                                        EDD:   07/28/19
 Best:          22w 2d     Det. By:  Early Ultrasound         EDD:   07/28/19
                                     (01/19/19)
ANATOMY:
 Cranium:               Within Normal Limits   Aortic Arch:            Normal appearance
 Cavum:                 Normal appearance      Ductal Arch:            Normal appearance
 Ventricles:            Normal appearance      Diaphragm:              Within Normal Limits
 Choroid Plexus:        Within Normal Limits   Stomach:                Seen
 Cerebellum:            Within Normal Limits   Abdomen:                Normal appearance
 Posterior Fossa:       Within Normal Limits   Abdominal Wall:         Normal appearance
 Nuchal Fold:           3.5 mm                 Cord Vessels:           Within Normal
                                                                       Limits, 3 vessels
 Face:                  Within Normal Limits   Kidneys:                Normal appearance
 Lips:                  Normal appearance      Bladder:                Seen
 Thoracic:              Normal appearance      Spine:                  Normal appearance
 Heart:                 Normal appearance      Upper Extremities:      Visualized
 RVOT:                  Normal appearance      Lower Extremities:      Visualized
 LVOT:                  Normal appearance
CERVIX UTERUS ADNEXA:
 Cervix
 Length:            4.5  cm.

[Series 1: us mfm ob detail+14 wk · 0.25mm/px · 103 acquisitions, 12 frames shown]
[im 4/103]
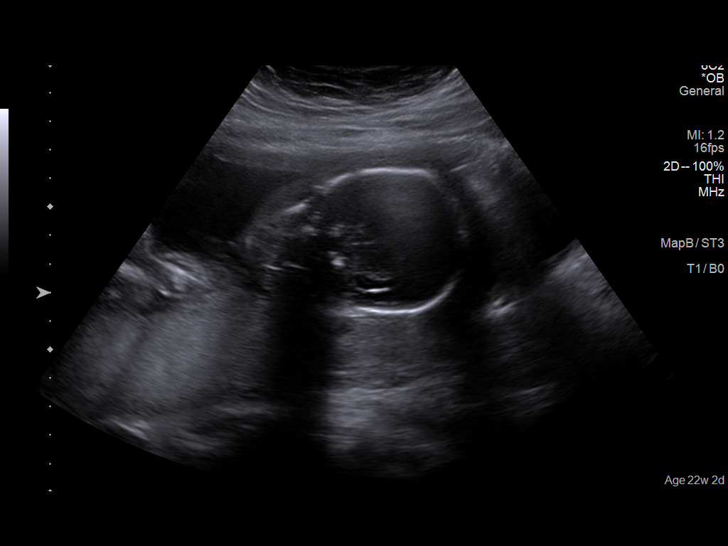
[im 12/103]
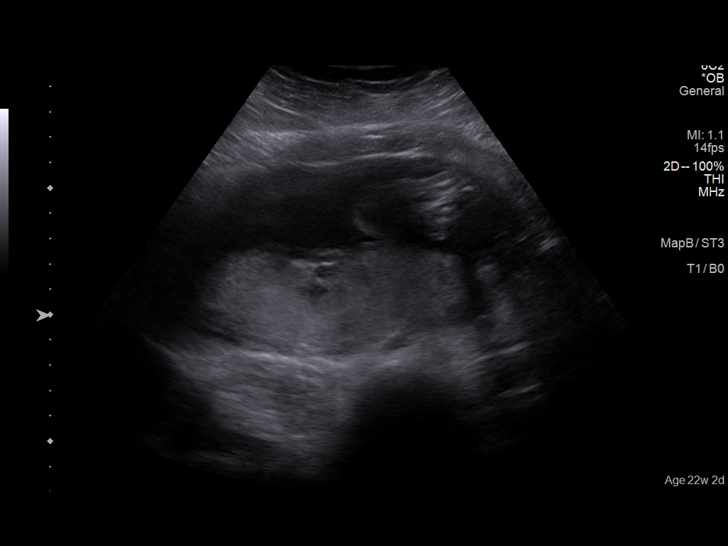
[im 19/103]
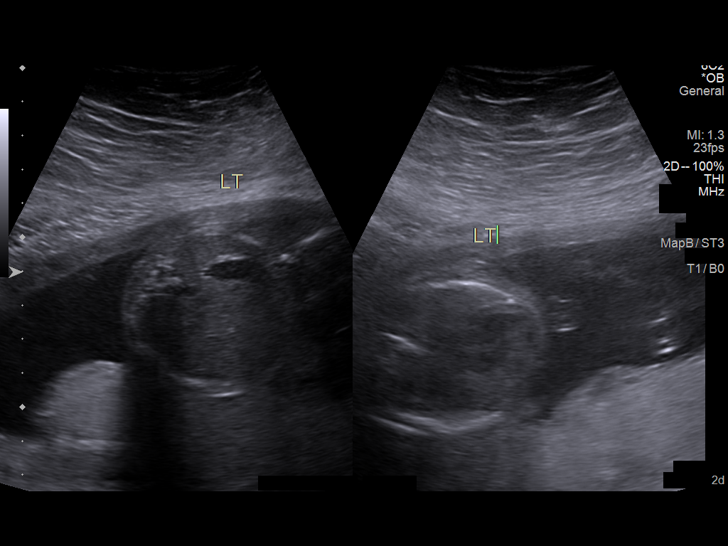
[im 31/103]
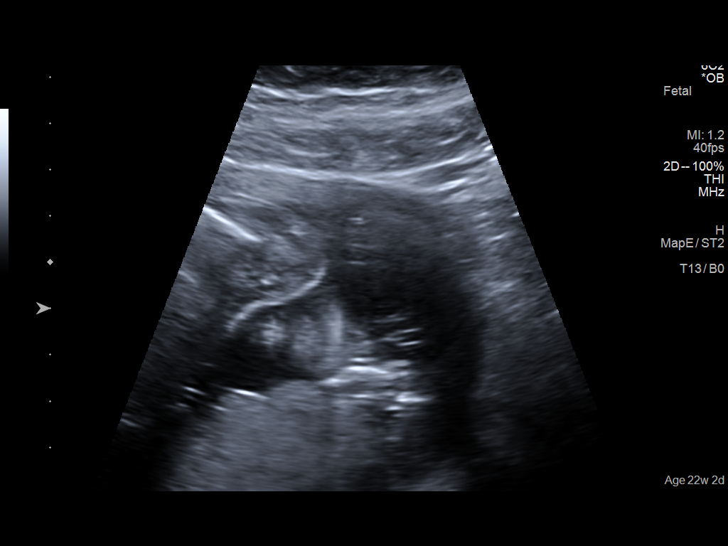
[im 38/103]
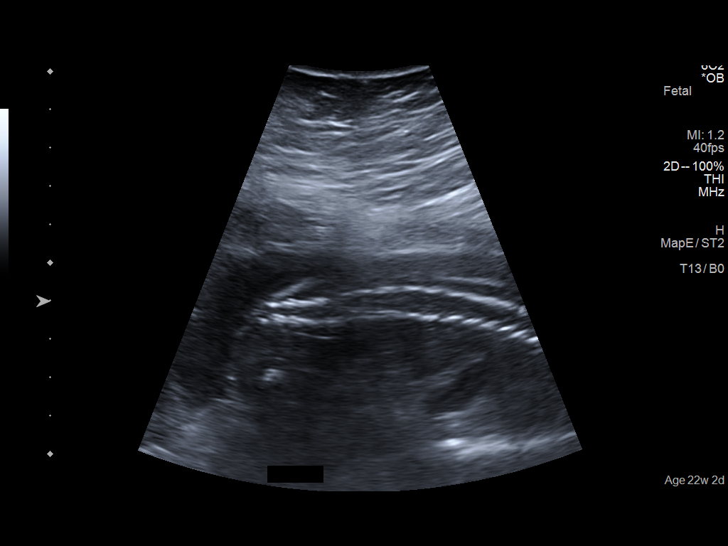
[im 46/103]
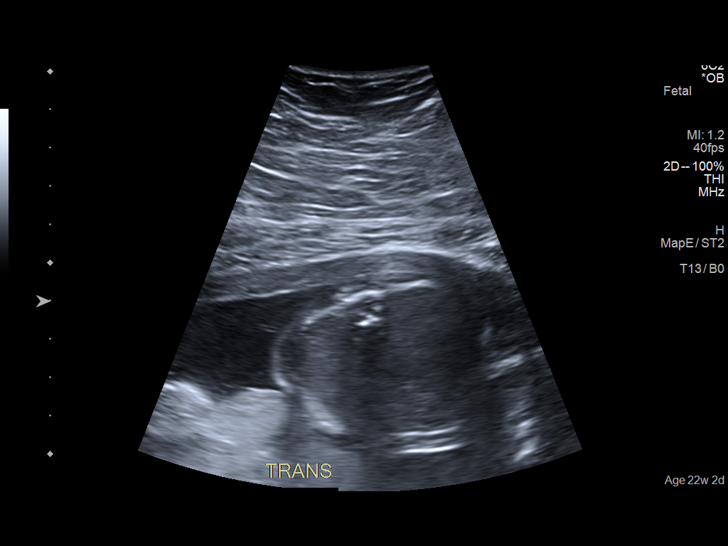
[im 57/103]
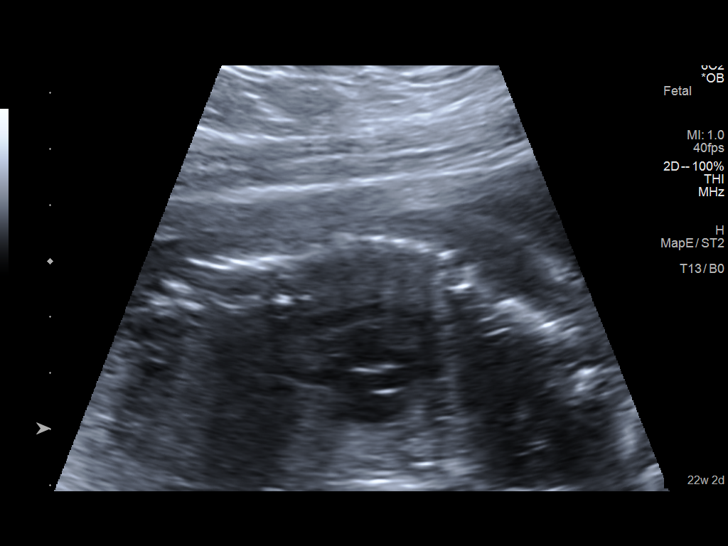
[im 65/103]
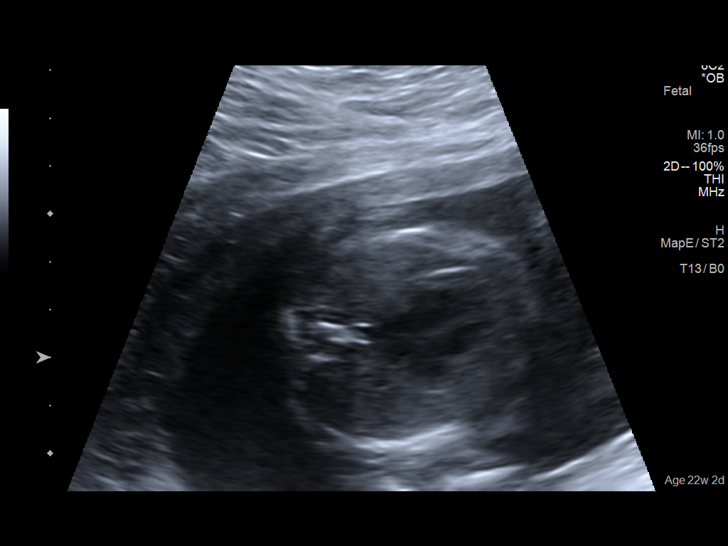
[im 72/103]
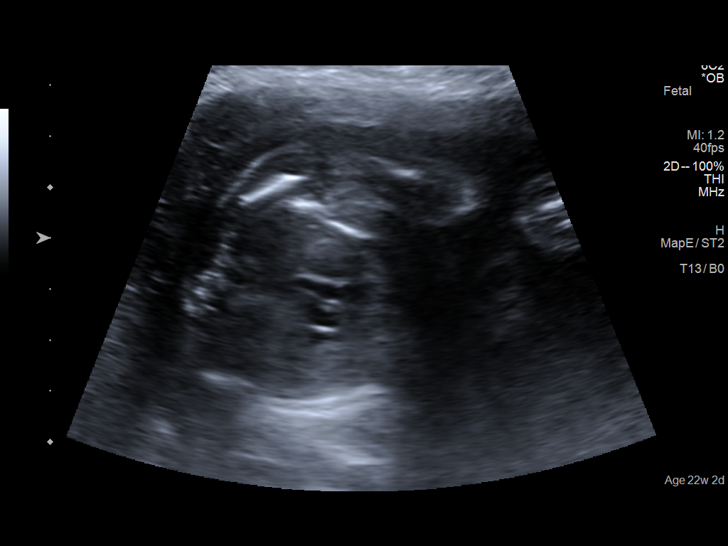
[im 84/103]
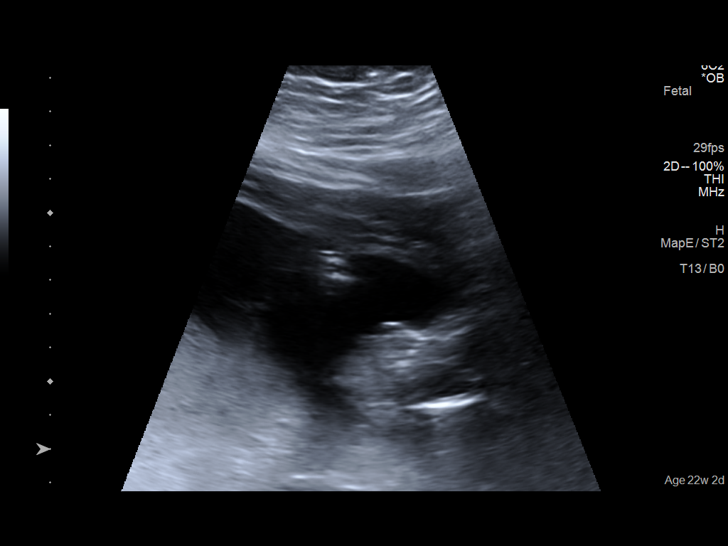
[im 91/103]
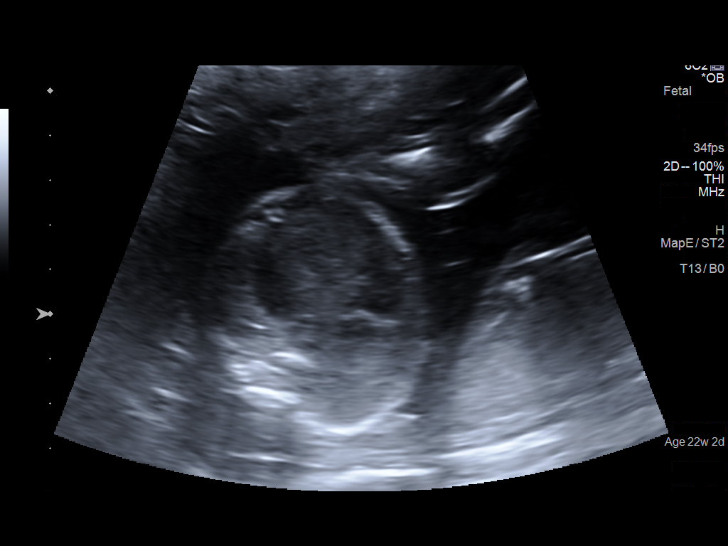
[im 99/103]
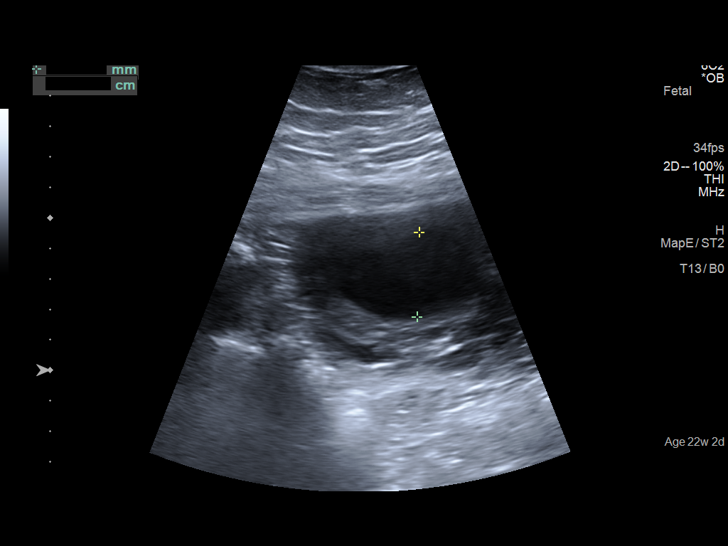

[12 of 28 positions shown; findings below may reference images not displayed]

IMPRESSION: Dear Dr.   MORAAD,

 Thank you for referring your patient  for a fetal anatomical
 survey for the inidcaiton of elevated BMI
 The fetus is at 22w 2d based on earliest u/s performed at
 [facility] on 01/19/2019 at 12w 6d giving an EDC of
 07/28/2019.

 There is a singleton gestation with subjectively normal
 amniotic fluid volume.

 The fetal biometry correlates with established dating.

 Detailed evaluation of the fetal anatomy was performed.  The
 fetal anatomical survey appears within normal limits within
 the resolution of ultrasound as described above.

 I scheduled a follow up growth in 6-8 weeks due to elevated
 BMI and labile BP .

 Thank you for allowing us to participate in your patient's care.

 assistance.

## 2020-01-03 ENCOUNTER — Other Ambulatory Visit: Payer: Self-pay

## 2020-01-03 ENCOUNTER — Encounter: Payer: Self-pay | Admitting: Certified Nurse Midwife

## 2020-01-03 ENCOUNTER — Ambulatory Visit (INDEPENDENT_AMBULATORY_CARE_PROVIDER_SITE_OTHER): Payer: Medicaid Other | Admitting: Certified Nurse Midwife

## 2020-01-03 VITALS — BP 124/80 | HR 84 | Ht 71.0 in | Wt 241.0 lb

## 2020-01-03 DIAGNOSIS — F419 Anxiety disorder, unspecified: Secondary | ICD-10-CM

## 2020-01-03 DIAGNOSIS — F329 Major depressive disorder, single episode, unspecified: Secondary | ICD-10-CM | POA: Diagnosis not present

## 2020-01-03 MED ORDER — ESCITALOPRAM OXALATE 10 MG PO TABS: ORAL_TABLET | ORAL | 5 refills | Status: DC

## 2020-01-03 NOTE — Progress Notes (Signed)
History of Present Illness:  Brandi Solomon is a 29 y.o. G4 P2022 who presents for a follow up regarding her postpartum depression.   She was started on Zoloft at her 6 week postpartum visit for depression/anxiety (08/31/2019). Her symptoms initially improved on the Zoloft, but about 2.5 months ago, she began to feel like the Zoloft was not working as well. She contemplated increasing the dose from 50 to 100 mgm, but decided to just stop taking the medicine instead. She felt the same off the medicine as she did on the Zoloft 50 mgm. She presented a month ago with concern for feeling more anxious and worrying constantly about her baby. She was also crying a lot. Her EPDS score was 11. She was started on Lexapro and is currently taking 10 mgm daily. She reports feeling less anxious and more like her old self. Having less mood swings. Crying less.  She has no SI. She is back at work at [employer]. Works from [employer] to 1 PM. Usually sleeps from 8PM to 1 AM. Pecola Leisure is sleeping thru the night.   She is currenlty on Depo Provera. Had one episode of spotting after her last dose of Depo x a few days.   PMHx: She  has a past medical history of Asthma, Back pain affecting pregnancy in third trimester (06/27/2019), Delivery of pregnancy by cesarean section (07/25/2019), Depression affecting pregnancy, Elevated blood pressure affecting pregnancy, antepartum (01/19/2019), Failed trial of labor, delivered, current hospitalization (07/25/2019), Fetal intolerance to labor, delivered, current hospitalization (07/25/2019), Obesity (BMI 30-39.9), Obesity affecting pregnancy (03/26/2019), Postpartum care following cesarean delivery (07/25/2019), Previous cesarean delivery, antepartum condition or complication (03/26/2019), S/P C-section (07/23/2019), and Supervision of other normal pregnancy, antepartum (01/05/2019). Also,  has a past surgical history that includes Cesarean section and Cesarean section (N/A, 07/23/2019)., family history  includes Diabetes Mellitus II in her maternal aunt; Hypertension in her maternal aunt; Skin cancer in her maternal aunt; Throat cancer in her maternal aunt.,  reports that she has quit smoking. She has never used smokeless tobacco. She reports previous alcohol use. She reports that she does not use drugs.  She has a current medication list which includes the following prescription(s): escitalopram, medroxyprogesterone, and multivitamin-prenatal. Also, has No Known Allergies.  ROS  Physical Exam:  BP 124/80   Pulse 84   Ht 5\' 11"  (1.803 m)   Wt (!) 241 lb (109.3 kg)   LMP  (LMP Unknown)   BMI 33.61 kg/m  Body mass index is 33.61 kg/m. Constitutional: Well nourished, well developed female in no acute distress. Neuro: Grossly intact Psych:  Normal mood and affect.    EPDS score is 6, was 11 at her last visit (initial score was 21)  Edinburgh Postnatal Depression Scale - 01/03/20 1627      Edinburgh Postnatal Depression Scale:  In the Past 7 Days   I have been able to laugh and see the funny side of things. 0    I have looked forward with enjoyment to things. 0    I have blamed myself unnecessarily when things went wrong. 1    I have been anxious or worried for no good reason. 2    I have felt scared or panicky for no good reason. 1    Things have been getting on top of me. 1    I have been so unhappy that I have had difficulty sleeping. 0    I have felt sad or miserable. 0    I have been  so unhappy that I have been crying. 1    The thought of harming myself has occurred to me. 0    Edinburgh Postnatal Depression Scale Total 6           Assessment/Plan: Improvement in depression/anxiety on Lexapro Continue Lexapro 10 mgm daily. Recommend staying on antidepressant for at least 6 months. Return in September for next Depo Fu in November/December for PPD/anxiety to see Dr Jerene Pitch  Farrel Conners, CNM [facility], [facility]  [place], [place]

## 2020-01-16 ENCOUNTER — Other Ambulatory Visit: Payer: Self-pay

## 2020-01-16 ENCOUNTER — Ambulatory Visit: Payer: Medicaid Other | Admitting: Podiatry

## 2020-01-16 ENCOUNTER — Ambulatory Visit (INDEPENDENT_AMBULATORY_CARE_PROVIDER_SITE_OTHER): Payer: Medicaid Other

## 2020-01-16 DIAGNOSIS — M722 Plantar fascial fibromatosis: Secondary | ICD-10-CM

## 2020-01-16 MED ORDER — METHYLPREDNISOLONE 4 MG PO TBPK: ORAL_TABLET | ORAL | 0 refills | Status: DC

## 2020-01-16 MED ORDER — MELOXICAM 15 MG PO TABS: 15.0000 mg | ORAL_TABLET | Freq: Every day | ORAL | 1 refills | Status: DC

## 2020-01-16 NOTE — Progress Notes (Signed)
f °

## 2020-01-16 NOTE — Progress Notes (Signed)
Subjective: 29 y.o. female presenting today as a new patient for evaluation of left heel pain is been going on for the past few months now.  Patient denies any history of injury or change in activity that would have elicited her pain.  She works on her feet all day at [employer].  She has been working at [employer] for the past 4 years now.  She has not done anything for treatment.  She presents for further treatment and evaluation   Past Medical History:  Diagnosis Date  . Asthma   . Back pain affecting pregnancy in third trimester 06/27/2019  . Delivery of pregnancy by cesarean section 07/25/2019  . Depression affecting pregnancy   . Elevated blood pressure affecting pregnancy, antepartum 01/19/2019   Labile - pt attributes to anxiety  No h/o meds  Offered baby aspirin  Consider baseline labs  . Failed trial of labor, delivered, current hospitalization 07/25/2019  . Fetal intolerance to labor, delivered, current hospitalization 07/25/2019  . Obesity (BMI 30-39.9)   . Obesity affecting pregnancy 03/26/2019   Advised to limit weight gain  . Postpartum care following cesarean delivery 07/25/2019  . Previous cesarean delivery, antepartum condition or complication 03/26/2019   Done for breech Pt desires TOLAC   MFMU calculator 66% success   [email or web address] Predicted chance of vaginal birth after cesarean:  58.3% 95% confidence interval:  [54.0%, 62.5%]  . S/P C-section 07/23/2019  . Supervision of other normal pregnancy, antepartum 01/05/2019     [facility]inic [facility] Prenatal Labs Dating  12 wk [place] Blood type: A/Positive/-- (08/14 1611)  Genetic Screen NIPS: Negative XX Antibody:Negative (08/14 1611) Anatomic [place] incomplete Rubella: 2.18 (08/14 1611) Varicella: Immune GTT  28 wk:    116  RPR: Non Reactive (08/14 1611)  Rhogam  not needed HBsAg: Negative (08/14 1611)  TDaP vaccine  05/24/2019                     HIV: Non Reactive (08/14 1611)  F      Objective: Physical Exam General: The patient is alert and oriented x3 in no acute distress.  Dermatology: Skin is warm, dry and supple bilateral lower extremities. Negative for open lesions or macerations bilateral.   Vascular: Dorsalis Pedis and Posterior Tibial pulses palpable bilateral.  Capillary fill time is immediate to all digits.  Neurological: Epicritic and protective threshold intact bilateral.   Musculoskeletal: Tenderness to palpation to the plantar aspect of the left heel along the plantar fascia. All other joints range of motion within normal limits bilateral. Strength 5/5 in all groups bilateral.   Radiographic exam: Normal osseous mineralization. Joint spaces preserved. No fracture/dislocation/boney destruction. No other soft tissue abnormalities or radiopaque foreign bodies.   Assessment: 1. Plantar fasciitis left foot  Plan of Care:  1. Patient evaluated. Xrays reviewed.   2. Injection of 0.5cc Celestone soluspan injected into the left plantar fascia.  3. Rx for Medrol Dose Pak placed 4. Rx for Meloxicam ordered for patient. 5. Plantar fascial band(s) dispensed  6. Instructed patient regarding therapies and modalities at home to alleviate symptoms.  7.  Recommend OTC insoles and new shoes at [employer] running store  8.  Return to clinic in 4 weeks.    *Works at [employer].  Originally from [place].  [place].  Felecia Shelling, DPM [facility]  Dr. Felecia Shelling, DPM    [address]  [place], [place] [address]                Office [phone]  Fax [phone]

## 2020-02-13 ENCOUNTER — Ambulatory Visit: Payer: Medicaid Other | Admitting: Podiatry

## 2020-02-14 IMAGING — US US MFM OB FOLLOW-UP
1 series · 13 of 28 positions shown · non-contrast
Comparison: none

PATIENT INFO:

PERFORMED BY:
                   Sonographer                              SADIA
SERVICE(S) PROVIDED:
 ----------------------------------------------------------------------
INDICATIONS:
  29 weeks gestation of pregnancy
FETAL EVALUATION:
 Num Of Fetuses:         1
 Fetal Heart Rate(bpm):  153
 Cardiac Activity:       Present
 Presentation:           Breech
 Placenta:               Posterior
 AFI Sum(cm)     %Tile       Largest Pocket(cm)
 9.98            12
 RUQ(cm)       RLQ(cm)       LUQ(cm)        LLQ(cm)
 4.55          2.12          3.31           0
BIOMETRY:
 BPD:        72  mm     G. Age:  28w 6d         26  %    CI:         69.2   %    70 - 86
                                                         FL/HC:      20.5   %    19.6 -
 HC:      276.4  mm     G. Age:  30w 2d         44  %    HC/AC:      1.10        0.99 -
 AC:      251.9  mm     G. Age:  29w 3d         47  %    FL/BPD:     78.6   %    71 - 87
 FL:       56.6  mm     G. Age:  29w 5d         48  %    FL/AC:      22.5   %    20 - 24
 HUM:      50.3  mm     G. Age:  29w 3d         50  %
 Est. FW:    6766  gm      3 lb 2 oz     47  %
GESTATIONAL AGE:
 Clinical EDD:  29w 2d                                        EDD:   07/28/19
 U/S Today:     29w 4d                                        EDD:   07/26/19
 Best:          29w 2d     Det. By:  Early Ultrasound         EDD:   07/28/19
                                     (01/19/19)
ANATOMY:
 Ventricles:            Within Normal Limits   Stomach:                Seen
 Heart:                 4-Chamber view         Kidneys:                Within Normal Limits
                        appears normal
 RVOT:                  Seen                   Bladder:                Seen
 LVOT:                  Seen                   Spine:                  Suboptimal views

[Series 1: us mfm ob follow-up · 0.25mm/px · 13 of 40 slices shown]
[im 2/40]
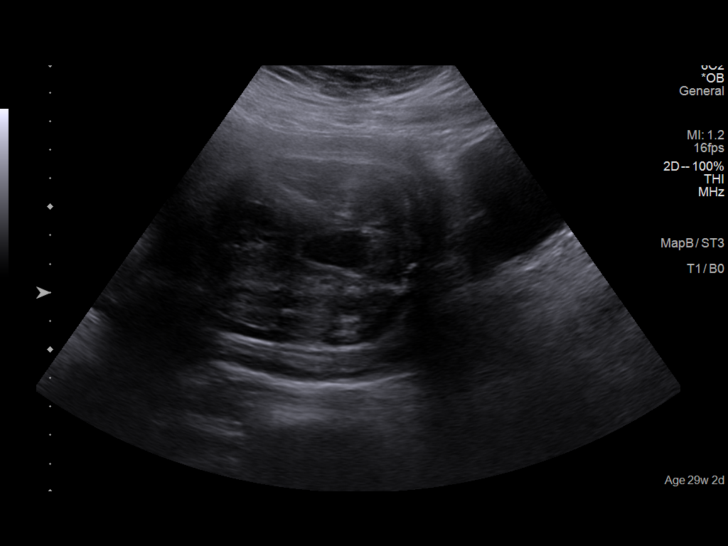
[im 5/40]
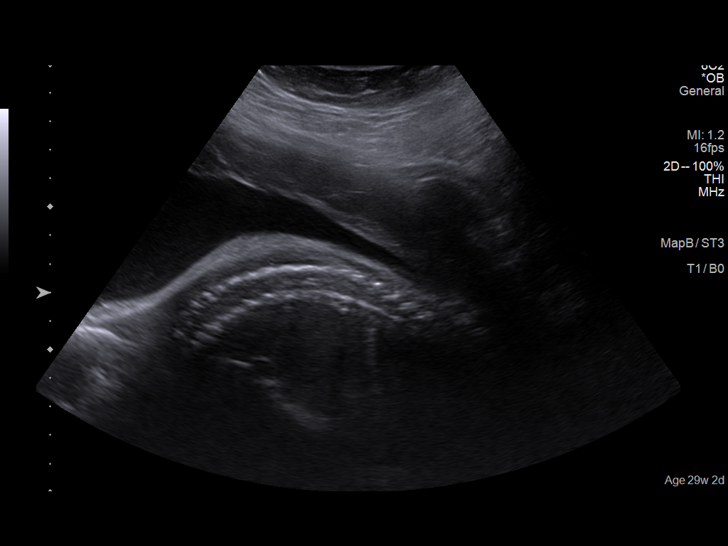
[im 8/40]
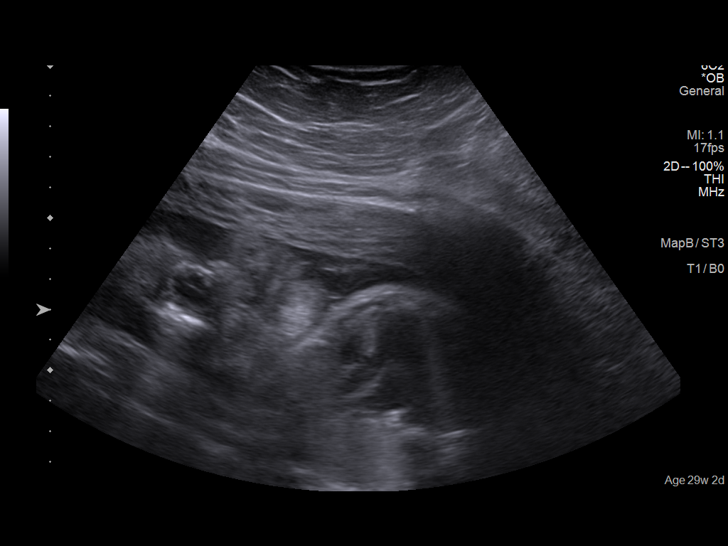
[im 11/40]
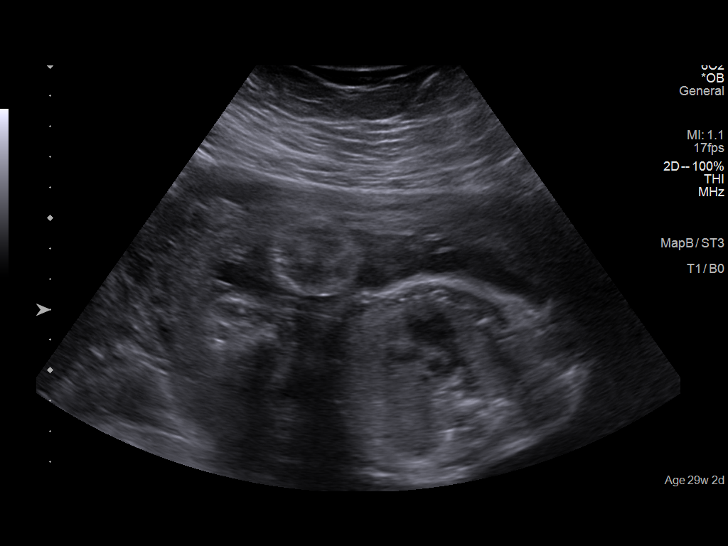
[im 14/40]
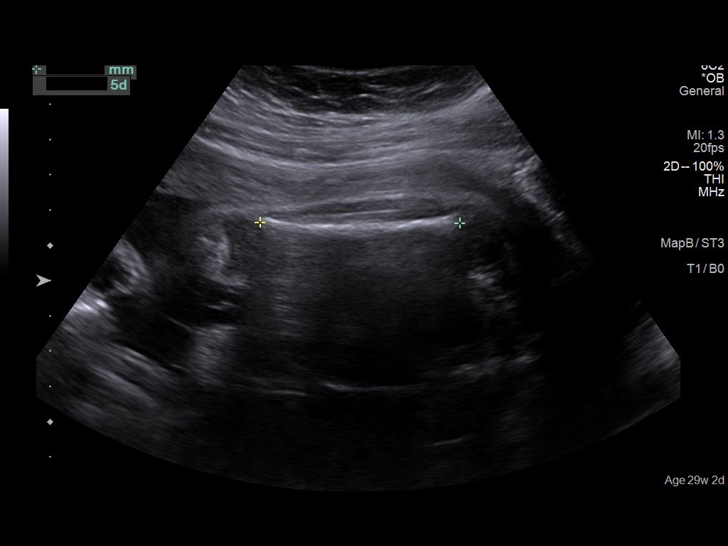
[im 16/40]
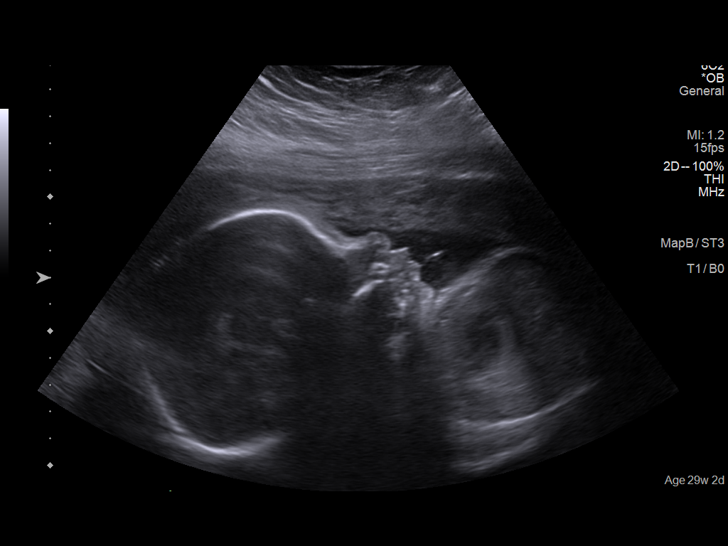
[im 21/40]
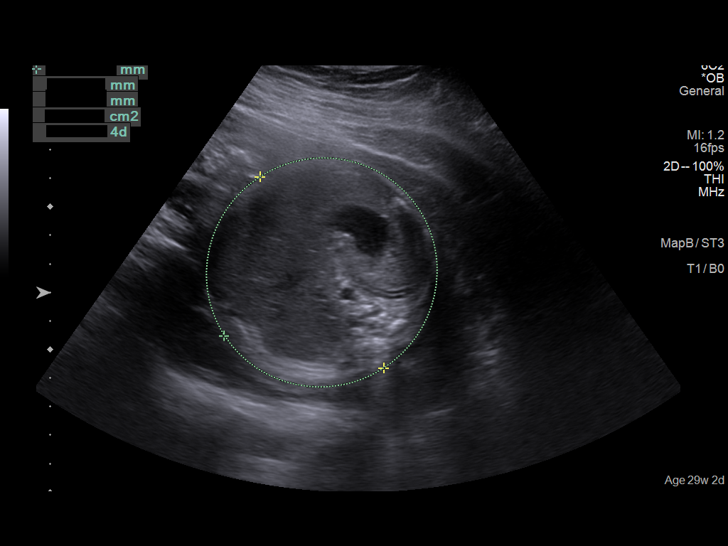
[im 24/40]
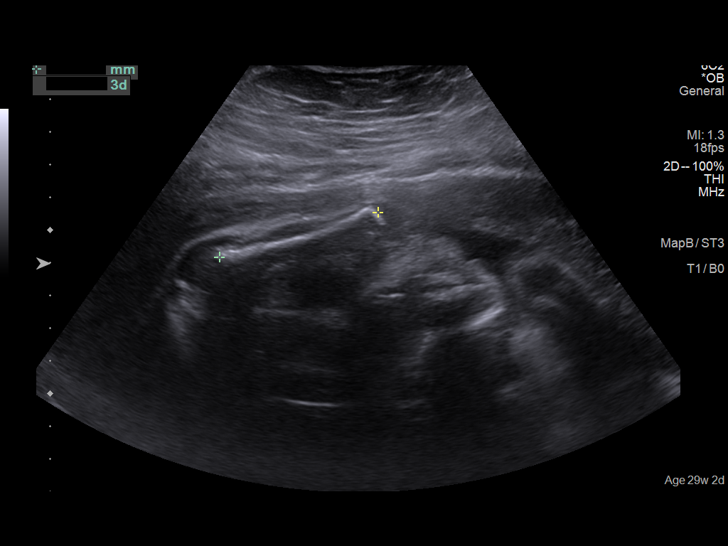
[im 27/40]
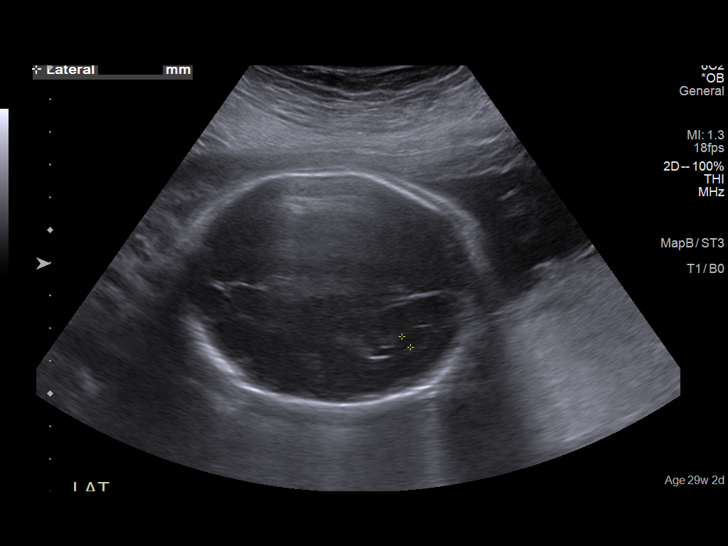
[im 29/40]
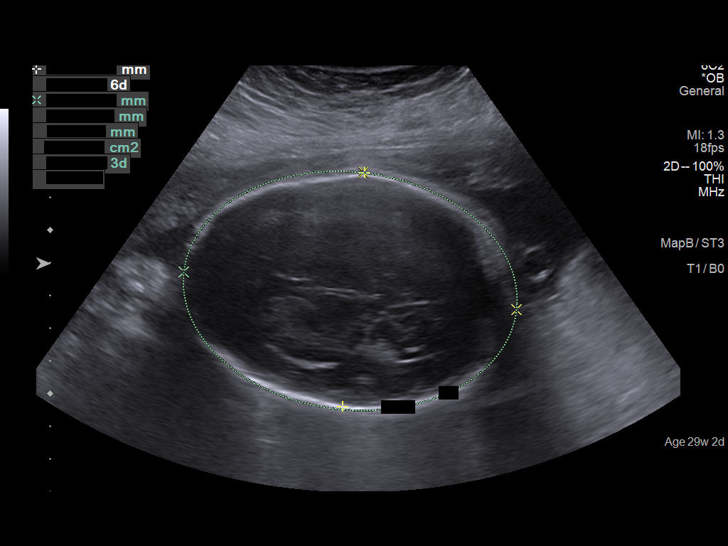
[im 32/40]
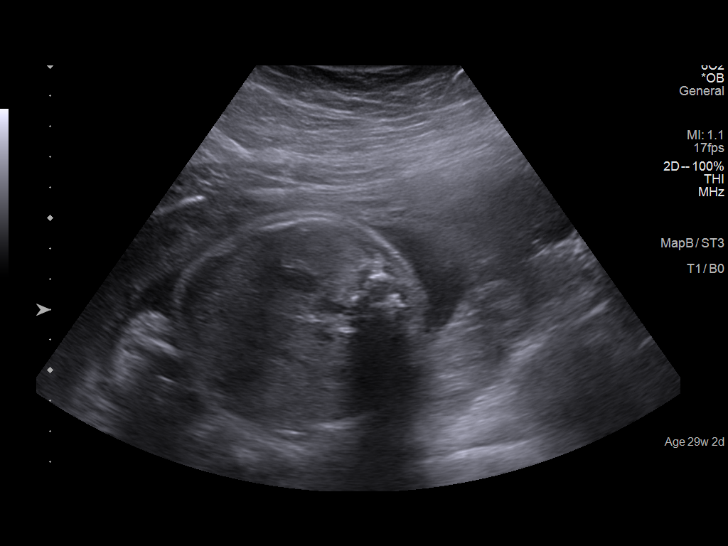
[im 35/40]
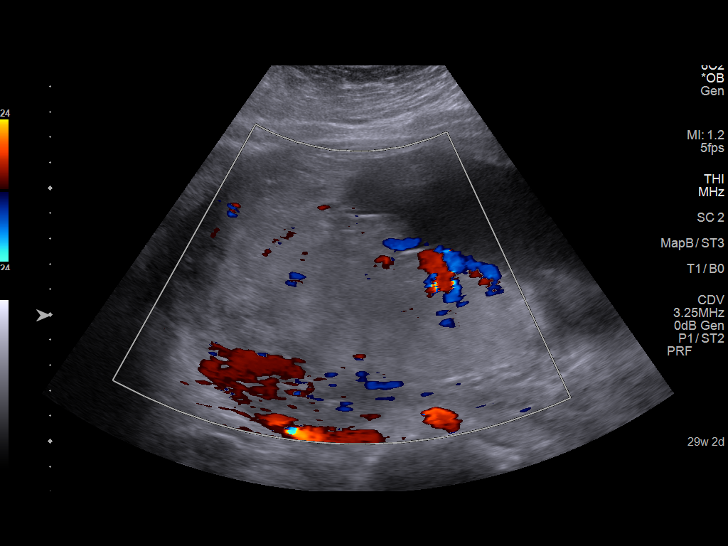
[im 38/40]
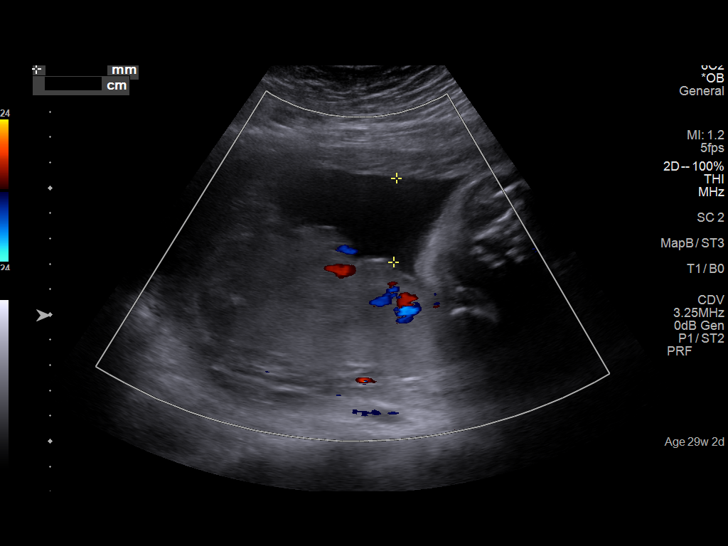

[13 of 28 positions shown; findings below may reference images not displayed]

IMPRESSION: Dear Colleagues:

 Thank you for referring your patient for a fetal growth
 evaluation due to elevated BMI.
 BP today is 117/85

 There is a singleton gestation at 29 weeks 2 days with normal
 amniotic fluid volume.  Dating is by earliest available
 ultrasound  performed at [facility] on 01/19/19;
 measurements were consistent with 12 weeks 6 days.

 The fetal biometry correlates with established dating.
 Adequate interval growth noted.

 Recommend follow-up scan for fetal growth in 4-6 weeks.
 Please schedule where convenient.

 Thank you for allowing us to participate in your patient's care.
 assistance.

                  Chaudry, Marco

## 2020-02-20 ENCOUNTER — Ambulatory Visit (INDEPENDENT_AMBULATORY_CARE_PROVIDER_SITE_OTHER): Payer: Medicaid Other

## 2020-02-20 ENCOUNTER — Other Ambulatory Visit: Payer: Self-pay

## 2020-02-20 DIAGNOSIS — Z3042 Encounter for surveillance of injectable contraceptive: Secondary | ICD-10-CM

## 2020-02-20 MED ORDER — MEDROXYPROGESTERONE ACETATE 150 MG/ML IM SUSP
150.0000 mg | Freq: Once | INTRAMUSCULAR | Status: AC
  Administered 2020-02-20: 150 mg via INTRAMUSCULAR

## 2020-02-20 NOTE — Progress Notes (Signed)
Pt here for depo inj which was given IM right deltoid.  NDC# [phone]

## 2020-02-21 ENCOUNTER — Ambulatory Visit: Payer: Medicaid Other

## 2020-05-08 ENCOUNTER — Other Ambulatory Visit: Payer: Self-pay

## 2020-05-08 ENCOUNTER — Ambulatory Visit: Payer: Self-pay | Admitting: Obstetrics and Gynecology

## 2020-05-14 ENCOUNTER — Ambulatory Visit: Payer: Medicaid Other

## 2021-12-29 ENCOUNTER — Ambulatory Visit (INDEPENDENT_AMBULATORY_CARE_PROVIDER_SITE_OTHER): Payer: Medicaid Other

## 2021-12-29 VITALS — BP 118/78 | HR 84 | Ht 71.0 in | Wt 200.0 lb

## 2021-12-29 DIAGNOSIS — N912 Amenorrhea, unspecified: Secondary | ICD-10-CM | POA: Diagnosis not present

## 2021-12-29 LAB — POCT URINE PREGNANCY: Preg Test, Ur: POSITIVE — AB

## 2021-12-29 NOTE — Progress Notes (Signed)
Subjective:    Brandi Solomon is a 31 y.o. female who presents for evaluation of amenorrhea. She believes she could be pregnant.  Pregnancy was not planned, but is desired.   Last period was normal.   Patient's last menstrual period was 10/21/2021 (approximate).    Lab Review Urine HCG: positive    Assessment:    Absence of menstruation.     Plan:    Pregnancy Test:  Positive: EDC: ~07/28/22. Briefly discussed positive results and sent to check out for scheduling for New OB appointments.

## 2022-01-01 ENCOUNTER — Ambulatory Visit (INDEPENDENT_AMBULATORY_CARE_PROVIDER_SITE_OTHER): Payer: Medicaid Other

## 2022-01-01 VITALS — Wt 200.0 lb

## 2022-01-01 DIAGNOSIS — Z3480 Encounter for supervision of other normal pregnancy, unspecified trimester: Secondary | ICD-10-CM | POA: Insufficient documentation

## 2022-01-01 DIAGNOSIS — Z3400 Encounter for supervision of normal first pregnancy, unspecified trimester: Secondary | ICD-10-CM

## 2022-01-01 DIAGNOSIS — Z3A00 Weeks of gestation of pregnancy not specified: Secondary | ICD-10-CM

## 2022-01-01 DIAGNOSIS — O0940 Supervision of pregnancy with grand multiparity, unspecified trimester: Secondary | ICD-10-CM

## 2022-01-01 NOTE — Progress Notes (Unsigned)
New OB Intake  I connected with  Brandi Solomon on 01/01/22 at 11:15 AM EDT by telephone Video Visit and verified that I am speaking with the correct person using two identifiers. Nurse is located at [facility] and pt is located at home.  I explained I am completing New OB Intake today. We discussed her EDD of 07/28/21 that is based on LMP of approx 10/21/2021. Pt is G5/P2022. I reviewed her allergies, medications, Medical/Surgical/OB history, and appropriate screenings. Based on history, this is a/an pregnancy uncomplicated .   Patient Active Problem List   Diagnosis Date Noted   History of 2 cesarean sections 07/23/2019   PROM (premature rupture of membranes) 07/22/2019   History of herpes genitalis 07/05/2019   Asthma 03/26/2019   Former smoker 03/26/2019   Depression 01/05/2019    Concerns addressed today None  Delivery Plans:  Plans to deliver at [facility].  Anatomy US Explained first scheduled [place] will be scheduled and an anatomy scan will be done at 20 weeks.  Labs Discussed genetic screening with patient. Patient desires genetic testing to be drawn with new OB labs. Discussed possible labs to be drawn at new OB appointment.  COVID Vaccine Patient has not had COVID vaccine.   Social Determinants of Health Food Insecurity: denies food insecurity Transportation: Patient denies transportation needs. Childcare: Discussed no children allowed at ultrasound appointments.   First visit review I reviewed new OB appt with pt. I explained she will have ob bloodwork and pap smear/pelvic exam if indicated. Explained pt will be seen by Paula Compton, CNM at first visit; encounter routed to appropriate provider.   Loran Senters, [facility] 01/01/2022  11:39 AM   Clinical Staff Provider  Office Location  [facility] Dating    Language  English Anatomy US    Flu Vaccine  offer Genetic Screen  NIPS:   TDaP vaccine   offer Hgb A1C or  GTT Early : Third trimester  :   Covid declines   LAB RESULTS   Rhogam   Blood Type     Feeding Plan Breast Antibody    Contraception 32yr IUD Rubella    Circumcision yes RPR     Pediatrician  undecided HBsAg     Support Person Daniel HIV    Prenatal Classes no Varicella     GBS  (For PCN allergy, check sensitivities)   BTL Consent  Hep C   VBAC Consent  Pap      Hgb Electro      CF      SMA

## 2022-01-05 ENCOUNTER — Other Ambulatory Visit: Payer: Medicaid Other

## 2022-01-05 DIAGNOSIS — Z3400 Encounter for supervision of normal first pregnancy, unspecified trimester: Secondary | ICD-10-CM

## 2022-01-05 LAB — OB RESULTS CONSOLE VARICELLA ZOSTER ANTIBODY, IGG: Varicella: IMMUNE

## 2022-01-06 LAB — CBC/D/PLT+RPR+RH+ABO+RUBIGG...
Antibody Screen: NEGATIVE
Basophils Absolute: 0 10*3/uL (ref 0.0–0.2)
Basos: 0 %
EOS (ABSOLUTE): 0.1 10*3/uL (ref 0.0–0.4)
Eos: 2 %
HCV Ab: NONREACTIVE
HIV Screen 4th Generation wRfx: NONREACTIVE
Hematocrit: 35.2 % (ref 34.0–46.6)
Hemoglobin: 11.9 g/dL (ref 11.1–15.9)
Hepatitis B Surface Ag: NEGATIVE
Immature Grans (Abs): 0 10*3/uL (ref 0.0–0.1)
Immature Granulocytes: 0 %
Lymphocytes Absolute: 1.8 10*3/uL (ref 0.7–3.1)
Lymphs: 34 %
MCH: 31 pg (ref 26.6–33.0)
MCHC: 33.8 g/dL (ref 31.5–35.7)
MCV: 92 fL (ref 79–97)
Monocytes Absolute: 0.3 10*3/uL (ref 0.1–0.9)
Monocytes: 5 %
Neutrophils Absolute: 3.2 10*3/uL (ref 1.4–7.0)
Neutrophils: 59 %
Platelets: 243 10*3/uL (ref 150–450)
RBC: 3.84 x10E6/uL (ref 3.77–5.28)
RDW: 13.1 % (ref 11.7–15.4)
RPR Ser Ql: NONREACTIVE
Rh Factor: POSITIVE
Rubella Antibodies, IGG: 1.47 index (ref >0.99)
Varicella zoster IgG: 662 index (ref >165)
WBC: 5.4 10*3/uL (ref 3.4–10.8)

## 2022-01-06 LAB — HCV INTERPRETATION

## 2022-01-10 LAB — MATERNIT 21 PLUS CORE, BLOOD
Fetal Fraction: 4
Result (T21): NEGATIVE
Trisomy 13 (Patau syndrome): NEGATIVE
Trisomy 18 (Edwards syndrome): NEGATIVE
Trisomy 21 (Down syndrome): NEGATIVE

## 2022-01-12 ENCOUNTER — Other Ambulatory Visit (HOSPITAL_COMMUNITY)
Admission: RE | Admit: 2022-01-12 | Discharge: 2022-01-12 | Disposition: A | Discharge status: Home / Self Care | Payer: Medicaid Other | Source: Ambulatory Visit | Attending: Obstetrics | Admitting: Obstetrics

## 2022-01-12 ENCOUNTER — Ambulatory Visit (INDEPENDENT_AMBULATORY_CARE_PROVIDER_SITE_OTHER): Payer: Medicaid Other | Admitting: Obstetrics

## 2022-01-12 VITALS — BP 118/78 | Wt 206.0 lb

## 2022-01-12 DIAGNOSIS — Z124 Encounter for screening for malignant neoplasm of cervix: Secondary | ICD-10-CM | POA: Insufficient documentation

## 2022-01-12 DIAGNOSIS — Z3480 Encounter for supervision of other normal pregnancy, unspecified trimester: Secondary | ICD-10-CM | POA: Diagnosis present

## 2022-01-12 DIAGNOSIS — Z3A11 11 weeks gestation of pregnancy: Secondary | ICD-10-CM

## 2022-01-12 NOTE — Progress Notes (Signed)
Nob today. No vb. No lof.

## 2022-01-12 NOTE — Progress Notes (Signed)
New Obstetric Patient H&P    Chief Complaint: "Desires prenatal care"   History of Present Illness: Patient is a 31 y.o. [id number] Not Hispanic or Latino female, LMP 10/21/2021 presents with amenorrhea and positive home pregnancy test. Based on her  LMP, her EDD is Estimated Date of Delivery: 07/28/22 and her EGA is [redacted]w[redacted]d. Cycles are 5. days, regular, and occur approximately every : 29 days. Her last pap smear was about 2 years ago and was no abnormalities.    She had a urine pregnancy test which was positive about 3 week(s)  ago. Her last menstrual period was normal and lasted for  about 5 day(s). Since her LMP she claims she has experienced constipation and nausea. She denies vaginal bleeding. Her past medical history is noncontributory. Her prior pregnancies are notable for previous tobacco use, a history of two Cesareaen sections.  Since her LMP, she admits to the use of tobacco products  no She claims she has gained   no pounds since the start of her pregnancy.  There are cats in the home in the home  no  She admits close contact with children on a regular basis  yes  She has had chicken pox in the past no She has had Tuberculosis exposures, symptoms, or previously tested positive for TB   no Current or past history of domestic violence. no  Genetic Screening/Teratology Counseling: (Includes patient, baby's father, or anyone in either family with:)   68. Patient's age >/= 63 at [facility]  no 2. Thalassemia ([place], [place], [place], or Asian background): MCV<80  no 3. Neural tube defect (meningomyelocele, spina bifida, anencephaly)  no 4. Congenital heart defect  no  5. Down syndrome  no 6. Tay-Sachs (Jewish, [place])  no 7. Canavan's Disease  no 8. Sickle cell disease or trait (African)  no  9. Hemophilia or other blood disorders  no  10. Muscular dystrophy  no  11. Cystic fibrosis  no  12. Huntington's Chorea  no  13. Mental retardation/autism  no 14. Other  inherited genetic or chromosomal disorder  no 15. Maternal metabolic disorder (DM, PKU, etc)  no 16. Patient or FOB with a child with a birth defect not listed above no  16a. Patient or FOB with a birth defect themselves no 17. Recurrent pregnancy loss, or stillbirth  no  18. Any medications since LMP other than prenatal vitamins (include vitamins, supplements, OTC meds, drugs, alcohol)  no 19. Any other genetic/environmental exposure to discuss  no  Infection History:   1. Lives with someone with TB or TB exposed  no  2. Patient or partner has history of genital herpes  no 3. Rash or viral illness since LMP  no 4. History of STI (GC, CT, HPV, syphilis, HIV)  no 5. History of recent travel :  no  Other pertinent information:  Yes, she has had two Cesarean sections; the first for Breech, and the second for elective. She is open to discussing TOLAC.     Review of Systems:10 point review of systems negative unless otherwise noted in HPI  Past Medical History:  Past Medical History:  Diagnosis Date  . Asthma   . Back pain affecting pregnancy in third trimester 06/27/2019  . Delivery of pregnancy by cesarean section 07/25/2019  . Depression affecting pregnancy   . Elevated blood pressure affecting pregnancy, antepartum 01/19/2019   Labile - pt attributes to anxiety  No h/o meds  Offered baby aspirin  Consider  baseline labs  . Failed trial of labor, delivered, current hospitalization 07/25/2019  . Fetal intolerance to labor, delivered, current hospitalization 07/25/2019  . Obesity (BMI 30-39.9)   . Obesity affecting pregnancy 03/26/2019   Advised to limit weight gain  . Postpartum care following cesarean delivery 07/25/2019  . Previous cesarean delivery, antepartum condition or complication 03/26/2019   Done for breech Pt desires TOLAC   MFMU calculator 66% success   [email or web address] Predicted chance of vaginal birth after cesarean:   58.3% 95% confidence interval:  [54.0%, 62.5%]  . S/P C-section 07/23/2019  . Supervision of other normal pregnancy, antepartum 01/05/2019     [facility]inic [facility] Prenatal Labs Dating  12 wk [place] Blood type: A/Positive/-- (08/14 1611)  Genetic Screen NIPS: Negative XX Antibody:Negative (08/14 1611) Anatomic [place] incomplete Rubella: 2.18 (08/14 1611) Varicella: Immune GTT  28 wk:    116  RPR: Non Reactive (08/14 1611)  Rhogam  not needed HBsAg: Negative (08/14 1611)  TDaP vaccine  05/24/2019                     HIV: Non Reactive (08/14 1611)  F    Past Surgical History:  Past Surgical History:  Procedure Laterality Date  . CESAREAN SECTION    . CESAREAN SECTION N/A 07/23/2019   Procedure: CESAREAN SECTION;  Surgeon: Nadara Mustard, MD;  Location: [facility] ORS;  Service: Obstetrics;  Laterality: N/A;    Gynecologic History: Patient's last menstrual period was 10/21/2021 (approximate).  Obstetric History: [id number]  Family History:  Family History  Problem Relation Age of Onset  . Alzheimer's disease Maternal Grandmother   . Throat cancer Maternal Aunt   . Skin cancer Maternal Aunt   . Hypertension Maternal Aunt   . Diabetes Mellitus II Maternal Aunt   . Diabetes Half-Sister     Social History:  Social History   Socioeconomic History  . Marital status: Single    Spouse name: Reuel Boom  . Number of children: 2  . Years of education: 72  . Highest education level: Not on file  Occupational History  . Occupation: works at [employer]: on weekends  Tobacco Use  . Smoking status: Former  . Smokeless tobacco: Never  . Tobacco comments:    Quit since finding out she was pregnant.   Vaping Use  . Vaping Use: Never used  Substance and Sexual Activity  . Alcohol use: Not Currently    Comment: Quit since finding out she was pregnant.   . Drug use: No  . Sexual activity: Yes    Birth control/protection: None  Other Topics Concern  . Not on file  Social History Narrative  . Not on  file   Social Determinants of Health   Financial Resource Strain: Low Risk  (01/01/2022)   Overall Financial Resource Strain (CARDIA)   . Difficulty of Paying Living Expenses: Not very hard  Food Insecurity: No Food Insecurity (01/01/2022)   Hunger Vital Sign   . Worried About Programme researcher, broadcasting/film/video in the Last Year: Never true   . Ran Out of Food in the Last Year: Never true  Transportation Needs: No Transportation Needs (01/01/2022)   PRAPARE - Transportation   . Lack of Transportation (Medical): No   . Lack of Transportation (Non-Medical): No  Physical Activity: Sufficiently Active (01/01/2022)   Exercise Vital Sign   . Days of Exercise per Week: 4 days   . Minutes of Exercise per Session: 90 min  Stress: No  Stress Concern Present (01/01/2022)   [employer] of Occupational Health - Occupational Stress Questionnaire   . Feeling of Stress : Not at all  Social Connections: Moderately Isolated (01/01/2022)   Social Connection and Isolation Panel [NHANES]   . Frequency of Communication with Friends and Family: More than three times a week   . Frequency of Social Gatherings with Friends and Family: Twice a week   . Attends Religious Services: Never   . Active Member of Clubs or Organizations: No   . Attends Banker Meetings: Never   . Marital Status: Living with partner  Intimate Partner Violence: Not At Risk (01/01/2022)   Humiliation, Afraid, Rape, and Kick questionnaire   . Fear of Current or Ex-Partner: No   . Emotionally Abused: No   . Physically Abused: No   . Sexually Abused: No    Allergies:  No Known Allergies  Medications: Prior to Admission medications   Medication Sig Start Date End Date Taking? Authorizing Provider  Prenatal Vit-Fe Fumarate-FA (MULTIVITAMIN-PRENATAL) 27-0.8 MG TABS tablet Take 2 tablets by mouth daily. Gummie    [provider]    Physical Exam Vitals: Blood pressure 118/78, weight 206 lb (93.4 kg), last menstrual period  10/21/2021.  General: NAD HEENT: normocephalic, anicteric Thyroid: no enlargement, no palpable nodules Pulmonary: No increased work of breathing, CTAB Cardiovascular: RRR, distal pulses 2+ Abdomen: NABS, soft, non-tender, non-distended.  Umbilicus without lesions.  No hepatomegaly, splenomegaly or masses palpable. No evidence of hernia  Genitourinary:  External: Normal external female genitalia.  Normal urethral meatus, normal  Bartholin's and Skene's glands.    Vagina: Normal vaginal mucosa, no evidence of prolapse.    Cervix: Grossly normal in appearance, no bleeding  Uterus: anteverted Non-enlarged, mobile, normal contour.  No CMT. Unablke to hear FHTS via doppler  Adnexa: ovaries non-enlarged, no adnexal masses  Rectal: deferred Extremities: no edema, erythema, or tenderness Neurologic: Grossly intact Psychiatric: mood appropriate, affect full   Assessment: 31 y.o. [id number] at [redacted]w[redacted]d presenting to initiate prenatal care  Plan: 1) Avoid alcoholic beverages. 2) Patient encouraged not to smoke.  3) Discontinue the use of all non-medicinal drugs and chemicals.  4) Take prenatal vitamins daily.  5) Nutrition, food safety (fish, cheese advisories, and high nitrite foods) and exercise discussed. 6) Hospital and practice style discussed with cross coverage system.  7) Genetic Screening, such as with 1st Trimester Screening, cell free fetal DNA, AFP testing, and Ultrasound, as well as with amniocentesis and CVS as appropriate, is discussed with patient. At the conclusion of today's visit patient requested genetic testing 8) Patient is asked about travel to areas at risk for the Zika virus, and counseled to avoid travel and exposure to mosquitoes or sexual partners who may have themselves been exposed to the virus. Testing is  discussed, and will be ordered as appropriate.   We discussed the TOLAC option. Breastfeeding is also encouraged. She formula fed her other babies. She has a dating  scan set for tomorrow. . RTC in 4 weeks. Mirna Mires, CNM  01/12/2022 3:44 PM

## 2022-01-14 ENCOUNTER — Ambulatory Visit (INDEPENDENT_AMBULATORY_CARE_PROVIDER_SITE_OTHER): Payer: Medicaid Other

## 2022-01-14 DIAGNOSIS — Z3400 Encounter for supervision of normal first pregnancy, unspecified trimester: Secondary | ICD-10-CM | POA: Diagnosis not present

## 2022-01-14 LAB — CERVICOVAGINAL ANCILLARY ONLY
Bacterial Vaginitis (gardnerella): NEGATIVE
Chlamydia: NEGATIVE
Comment: NEGATIVE
Comment: NEGATIVE
Comment: NEGATIVE
Comment: NORMAL
Neisseria Gonorrhea: NEGATIVE
Trichomonas: NEGATIVE

## 2022-01-18 LAB — CYTOLOGY - PAP
Comment: NEGATIVE
Diagnosis: NEGATIVE
Diagnosis: REACTIVE
High risk HPV: NEGATIVE

## 2022-02-09 ENCOUNTER — Ambulatory Visit (INDEPENDENT_AMBULATORY_CARE_PROVIDER_SITE_OTHER): Payer: Medicaid Other | Admitting: Obstetrics

## 2022-02-09 VITALS — BP 122/70 | Wt 211.0 lb

## 2022-02-09 DIAGNOSIS — Z3A12 12 weeks gestation of pregnancy: Secondary | ICD-10-CM

## 2022-02-09 DIAGNOSIS — Z3480 Encounter for supervision of other normal pregnancy, unspecified trimester: Secondary | ICD-10-CM

## 2022-02-09 NOTE — Progress Notes (Signed)
No vb. No lof.  

## 2022-02-09 NOTE — Progress Notes (Signed)
Routine Prenatal Care Visit  Subjective  Brandi Solomon is a 31 y.o. [id number] at [redacted]w[redacted]d being seen today for ongoing prenatal care.  She is currently monitored for the following issues for this high-risk pregnancy and has Depression; Asthma; Former smoker; History of herpes genitalis; PROM (premature rupture of membranes); History of 2 cesarean sections; and Supervision of other normal pregnancy, antepartum on their problem list.  ----------------------------------------------------------------------------------- Patient reports no complaints.    .  .   Pincus Large Fluid denies.  ----------------------------------------------------------------------------------- The following portions of the patient's history were reviewed and updated as appropriate: allergies, current medications, past family history, past medical history, past social history, past surgical history and problem list. Problem list updated.  Objective  Blood pressure 122/70, weight 211 lb (95.7 kg), last menstrual period 10/21/2021. Pregravid weight 197 lb (89.4 kg) Total Weight Gain 14 lb (6.35 kg) Urinalysis: Urine Protein    Urine Glucose    Fetal Status:           General:  Alert, oriented and cooperative. Patient is in no acute distress.  Skin: Skin is warm and dry. No rash noted.   Cardiovascular: Normal heart rate noted  Respiratory: Normal respiratory effort, no problems with respiration noted  Abdomen: Soft, gravid, appropriate for gestational age.       Pelvic:  Cervical exam deferred        Extremities: Normal range of motion.     Mental Status: Normal mood and affect. Normal behavior. Normal judgment and thought content.   Assessment   30 y.o. G2I9485 at [redacted]w[redacted]d by  08/19/2022, by Ultrasound presenting for routine prenatal visit  Plan   fifth Problems (from 01/01/22 to present)    No problems associated with this episode.       Preterm labor symptoms and general obstetric precautions including but not  limited to vaginal bleeding, contractions, leaking of fluid and fetal movement were reviewed in detail with the patient. Please refer to After Visit Summary for other counseling recommendations.  Anatomy scan ordered for [redacted] weeks gestation  No follow-ups on file.  Mirna Mires, CNM  02/09/2022 2:04 PM

## 2022-03-08 ENCOUNTER — Ambulatory Visit (INDEPENDENT_AMBULATORY_CARE_PROVIDER_SITE_OTHER): Payer: Medicaid Other | Admitting: Obstetrics

## 2022-03-08 VITALS — BP 120/76 | Wt 219.0 lb

## 2022-03-08 DIAGNOSIS — Z87891 Personal history of nicotine dependence: Secondary | ICD-10-CM

## 2022-03-08 DIAGNOSIS — O99512 Diseases of the respiratory system complicating pregnancy, second trimester: Secondary | ICD-10-CM

## 2022-03-08 DIAGNOSIS — O09292 Supervision of pregnancy with other poor reproductive or obstetric history, second trimester: Secondary | ICD-10-CM

## 2022-03-08 DIAGNOSIS — F32A Depression, unspecified: Secondary | ICD-10-CM

## 2022-03-08 DIAGNOSIS — Z3480 Encounter for supervision of other normal pregnancy, unspecified trimester: Secondary | ICD-10-CM

## 2022-03-08 DIAGNOSIS — J45909 Unspecified asthma, uncomplicated: Secondary | ICD-10-CM

## 2022-03-08 DIAGNOSIS — Z3A16 16 weeks gestation of pregnancy: Secondary | ICD-10-CM

## 2022-03-08 DIAGNOSIS — O99342 Other mental disorders complicating pregnancy, second trimester: Secondary | ICD-10-CM

## 2022-03-08 DIAGNOSIS — O99332 Smoking (tobacco) complicating pregnancy, second trimester: Secondary | ICD-10-CM

## 2022-03-08 NOTE — Progress Notes (Signed)
Routine Prenatal Care Visit  Subjective  Brandi Solomon is a 31 y.o. Q7H4193 at [redacted]w[redacted]d being seen today for ongoing prenatal care.  She is currently monitored for the following issues for this low-risk pregnancy and has Depression; Asthma; Former smoker; History of herpes genitalis; PROM (premature rupture of membranes); History of 2 cesarean sections; and Supervision of other normal pregnancy, antepartum on their problem list.  ----------------------------------------------------------------------------------- Patient reports no complaints.   Contractions: Not present. Vag. Bleeding: None.  Movement: Absent. Leaking Fluid denies.  ----------------------------------------------------------------------------------- The following portions of the patient's history were reviewed and updated as appropriate: allergies, current medications, past family history, past medical history, past social history, past surgical history and problem list. Problem list updated.  Objective  Blood pressure 120/76, weight 99.3 kg, last menstrual period 10/21/2021. Pregravid weight 89.4 kg Total Weight Gain 9.979 kg Urinalysis: Urine Protein    Urine Glucose    Fetal Status:     Movement: Absent     General:  Alert, oriented and cooperative. Patient is in no acute distress.  Skin: Skin is warm and dry. No rash noted.   Cardiovascular: Normal heart rate noted  Respiratory: Normal respiratory effort, no problems with respiration noted  Abdomen: Soft, gravid, appropriate for gestational age. Pain/Pressure: Absent     Pelvic:  Cervical exam deferred        Extremities: Normal range of motion.     Mental Status: Normal mood and affect. Normal behavior. Normal judgment and thought content.   Assessment   31 y.o. [id number] at [redacted]w[redacted]d by  08/19/2022, by Ultrasound presenting for routine prenatal visit  Plan   fifth Problems (from 01/01/22 to present)    No problems associated with this episode.       Preterm labor  symptoms and general obstetric precautions including but not limited to vaginal bleeding, contractions, leaking of fluid and fetal movement were reviewed in detail with the patient. Please refer to After Visit Summary for other counseling recommendations.   No follow-ups on file.  Imagene Riches, CNM  03/08/2022 4:25 PM

## 2022-03-08 NOTE — Progress Notes (Signed)
No vb. No lof.  

## 2022-03-09 ENCOUNTER — Encounter: Payer: Medicaid Other | Admitting: Obstetrics & Gynecology

## 2022-03-25 ENCOUNTER — Ambulatory Visit
Admission: RE | Admit: 2022-03-25 | Discharge: 2022-03-25 | Disposition: A | Discharge status: Home / Self Care | Payer: Medicaid Other | Source: Ambulatory Visit | Attending: Obstetrics | Admitting: Obstetrics

## 2022-03-25 DIAGNOSIS — Z3482 Encounter for supervision of other normal pregnancy, second trimester: Secondary | ICD-10-CM | POA: Diagnosis not present

## 2022-03-25 DIAGNOSIS — Z3A18 18 weeks gestation of pregnancy: Secondary | ICD-10-CM | POA: Diagnosis not present

## 2022-03-25 DIAGNOSIS — Z3480 Encounter for supervision of other normal pregnancy, unspecified trimester: Secondary | ICD-10-CM

## 2022-03-25 DIAGNOSIS — Z3689 Encounter for other specified antenatal screening: Secondary | ICD-10-CM | POA: Diagnosis not present

## 2022-04-03 ENCOUNTER — Other Ambulatory Visit: Payer: Self-pay | Admitting: Obstetrics

## 2022-04-03 DIAGNOSIS — Z3480 Encounter for supervision of other normal pregnancy, unspecified trimester: Secondary | ICD-10-CM

## 2022-04-05 ENCOUNTER — Ambulatory Visit (INDEPENDENT_AMBULATORY_CARE_PROVIDER_SITE_OTHER): Payer: Medicaid Other | Admitting: Obstetrics

## 2022-04-05 VITALS — BP 120/81 | HR 89 | Wt 226.0 lb

## 2022-04-05 DIAGNOSIS — Z87891 Personal history of nicotine dependence: Secondary | ICD-10-CM

## 2022-04-05 DIAGNOSIS — O34219 Maternal care for unspecified type scar from previous cesarean delivery: Secondary | ICD-10-CM

## 2022-04-05 DIAGNOSIS — O99512 Diseases of the respiratory system complicating pregnancy, second trimester: Secondary | ICD-10-CM

## 2022-04-05 DIAGNOSIS — J45909 Unspecified asthma, uncomplicated: Secondary | ICD-10-CM

## 2022-04-05 DIAGNOSIS — O09292 Supervision of pregnancy with other poor reproductive or obstetric history, second trimester: Secondary | ICD-10-CM

## 2022-04-05 DIAGNOSIS — O99342 Other mental disorders complicating pregnancy, second trimester: Secondary | ICD-10-CM

## 2022-04-05 DIAGNOSIS — Z3480 Encounter for supervision of other normal pregnancy, unspecified trimester: Secondary | ICD-10-CM

## 2022-04-05 DIAGNOSIS — F32A Depression, unspecified: Secondary | ICD-10-CM

## 2022-04-05 DIAGNOSIS — O99332 Smoking (tobacco) complicating pregnancy, second trimester: Secondary | ICD-10-CM

## 2022-04-05 DIAGNOSIS — Z3A20 20 weeks gestation of pregnancy: Secondary | ICD-10-CM

## 2022-04-05 LAB — POCT URINALYSIS DIPSTICK OB
Appearance: NORMAL
Bilirubin, UA: NEGATIVE
Blood, UA: NEGATIVE
Glucose, UA: NEGATIVE
Ketones, UA: NEGATIVE
Leukocytes, UA: NEGATIVE
Nitrite, UA: NEGATIVE
Odor: NORMAL
POC,PROTEIN,UA: NEGATIVE
Spec Grav, UA: <=1.005 — AB (ref 1.010–1.025)
Urobilinogen, UA: 0.2 E.U./dL
pH, UA: 7 (ref 5.0–8.0)

## 2022-04-05 NOTE — Progress Notes (Signed)
Anatomy u/s last week. No vb. No lof.

## 2022-04-05 NOTE — Progress Notes (Signed)
Routine Prenatal Care Visit  Subjective  Brandi Solomon is a 31 y.o. [id number] at [redacted]w[redacted]d being seen today for ongoing prenatal care.  She is currently monitored for the following issues for this high-risk pregnancy and has Depression; Asthma; Former smoker; History of herpes genitalis; PROM (premature rupture of membranes); History of 2 cesarean sections; and Supervision of other normal pregnancy, antepartum on their problem list.  ----------------------------------------------------------------------------------- Patient reports no complaints.  She is having a gender reveal in several weeks. Her anatomy scan was incomplete. She has another f/u scan ordered.  Contractions: Not present. Vag. Bleeding: None.  Movement: Present. Leaking Fluid denies.  ----------------------------------------------------------------------------------- The following portions of the patient's history were reviewed and updated as appropriate: allergies, current medications, past family history, past medical history, past social history, past surgical history and problem list. Problem list updated.  Objective  Blood pressure 120/81, pulse 89, weight 226 lb (102.5 kg), last menstrual period 10/21/2021. Pregravid weight 197 lb (89.4 kg) Total Weight Gain 29 lb (13.2 kg) Urinalysis: Urine Protein Negative  Urine Glucose Negative  Fetal Status:     Movement: Present     General:  Alert, oriented and cooperative. Patient is in no acute distress.  Skin: Skin is warm and dry. No rash noted.   Cardiovascular: Normal heart rate noted  Respiratory: Normal respiratory effort, no problems with respiration noted  Abdomen: Soft, gravid, appropriate for gestational age. Pain/Pressure: Absent     Pelvic:  Cervical exam deferred        Extremities: Normal range of motion.     Mental Status: Normal mood and affect. Normal behavior. Normal judgment and thought content.   Assessment   31 y.o. Q4O9629 at [redacted]w[redacted]d by  08/19/2022, by  Ultrasound presenting for routine prenatal visit  Plan   fifth Problems (from 01/01/22 to present)    No problems associated with this episode.       Preterm labor symptoms and general obstetric precautions including but not limited to vaginal bleeding, contractions, leaking of fluid and fetal movement were reviewed in detail with the patient. Please refer to After Visit Summary for other counseling recommendations.   Return in about 4 weeks (around 05/03/2022) for return OB.  Imagene Riches, CNM  04/05/2022 1:34 PM

## 2022-04-12 ENCOUNTER — Other Ambulatory Visit: Payer: Self-pay

## 2022-04-12 ENCOUNTER — Ambulatory Visit
Admission: EM | Admit: 2022-04-12 | Discharge: 2022-04-12 | Disposition: A | Discharge status: Home / Self Care | Payer: Medicaid Other | Attending: Urgent Care | Admitting: Urgent Care

## 2022-04-12 DIAGNOSIS — J988 Other specified respiratory disorders: Secondary | ICD-10-CM

## 2022-04-12 DIAGNOSIS — R0981 Nasal congestion: Secondary | ICD-10-CM

## 2022-04-12 DIAGNOSIS — Z3A21 21 weeks gestation of pregnancy: Secondary | ICD-10-CM | POA: Diagnosis not present

## 2022-04-12 DIAGNOSIS — R052 Subacute cough: Secondary | ICD-10-CM | POA: Diagnosis not present

## 2022-04-12 DIAGNOSIS — B9789 Other viral agents as the cause of diseases classified elsewhere: Secondary | ICD-10-CM

## 2022-04-12 DIAGNOSIS — J4530 Mild persistent asthma, uncomplicated: Secondary | ICD-10-CM

## 2022-04-12 MED ORDER — PREDNISONE 20 MG PO TABS: 20.0000 mg | ORAL_TABLET | Freq: Every day | ORAL | 0 refills | Status: DC

## 2022-04-12 MED ORDER — ALBUTEROL SULFATE HFA 108 (90 BASE) MCG/ACT IN AERS: 1.0000 | INHALATION_SPRAY | Freq: Four times a day (QID) | RESPIRATORY_TRACT | 0 refills | Status: AC | PRN

## 2022-04-12 MED ORDER — CETIRIZINE HCL 10 MG PO TABS: 10.0000 mg | ORAL_TABLET | Freq: Every day | ORAL | 0 refills | Status: DC

## 2022-04-12 NOTE — Discharge Instructions (Signed)
Please talk with your OB before starting prednisone. Use a neti pot or nasal saline flushes to help your sinuses. Start the albuterol inhaler and use Zyrtec.

## 2022-04-12 NOTE — ED Triage Notes (Signed)
Pt c/o nasal congestion, sore throat, prod cough x 5 days-no relief with robitussin dm and afrin nasal spray-pt is [redacted] weeks pregnant-NAD-steady gait

## 2022-04-12 NOTE — ED Provider Notes (Signed)
[facility]  Note:  This document was prepared using Systems analyst and may include unintentional dictation errors.  MRN: [id number] DOB: 10/24/1990  Subjective:   Brandi Solomon is a 31 y.o. female presenting for 5-day history of acute onset persistent sinus congestion, sinus pressure, throat pain, productive cough.  Patient has also experienced chest tightness, chest pain and shortness of breath.  Has a history of asthma but is out of an inhaler.  She is also [redacted] weeks pregnant.  No smoking.  Patient has done multiple COVID test and does not want a repeat as they were all negative.  No current facility-administered medications for this encounter.  Current Outpatient Medications:    Prenatal Vit-Fe Fumarate-FA (MULTIVITAMIN-PRENATAL) 27-0.8 MG TABS tablet, Take 2 tablets by mouth daily. Gummie, Disp: , Rfl:    No Known Allergies  Past Medical History:  Diagnosis Date   Asthma    Back pain affecting pregnancy in third trimester 06/27/2019   Delivery of pregnancy by cesarean section 07/25/2019   Depression affecting pregnancy    Elevated blood pressure affecting pregnancy, antepartum 01/19/2019   Labile - pt attributes to anxiety  No h/o meds  Offered baby aspirin  Consider baseline labs   Failed trial of labor, delivered, current hospitalization 07/25/2019   Fetal intolerance to labor, delivered, current hospitalization 07/25/2019   Obesity (BMI 30-39.9)    Obesity affecting pregnancy 03/26/2019   Advised to limit weight gain   Postpartum care following cesarean delivery 07/25/2019   Previous cesarean delivery, antepartum condition or complication 32/67/1245   Done for breech Pt desires TOLAC   MFMU calculator 66% success   [email or web address] Predicted chance of vaginal birth after cesarean:  58.3% 95% confidence interval:  [54.0%, 62.5%]   S/P C-section 07/23/2019   Supervision of other  normal pregnancy, antepartum 01/05/2019     [facility]inic [facility] Prenatal Labs Dating  12 wk [place] Blood type: A/Positive/-- (08/14 1611)  Genetic Screen NIPS: Negative XX Antibody:Negative (08/14 1611) Anatomic [place] incomplete Rubella: 2.18 (08/14 1611) Varicella: Immune GTT  28 wk:    116  RPR: Non Reactive (08/14 1611)  Rhogam  not needed HBsAg: Negative (08/14 1611)  TDaP vaccine  05/24/2019                     HIV: Non Reactive (08/14 1611)  F     Past Surgical History:  Procedure Laterality Date   CESAREAN SECTION     CESAREAN SECTION N/A 07/23/2019   Procedure: CESAREAN SECTION;  Surgeon: Gae Dry, MD;  Location: [facility] ORS;  Service: Obstetrics;  Laterality: N/A;    Family History  Problem Relation Age of Onset   Alzheimer's disease Maternal Grandmother    Throat cancer Maternal Aunt    Skin cancer Maternal Aunt    Hypertension Maternal Aunt    Diabetes Mellitus II Maternal Aunt    Diabetes Half-Sister     Social History   Tobacco Use   Smoking status: Former    Types: Cigarettes   Smokeless tobacco: Never   Tobacco comments:    Quit since finding out she was pregnant.   Vaping Use   Vaping Use: Never used  Substance Use Topics   Alcohol use: Not Currently    Comment: Quit since finding out she was pregnant.    Drug use: No    ROS   Objective:   Vitals: BP (!) 151/92 (BP Location: Right Arm)   Pulse 92  Temp 98.7 F (37.1 C) (Oral)   Resp 20   Ht 5\' 11"  (1.803 m)   Wt 220 lb (99.8 kg)   LMP 10/21/2021 (Approximate)   SpO2 98%   BMI 30.68 kg/m   Physical Exam Constitutional:      General: She is not in acute distress.    Appearance: Normal appearance. She is well-developed and normal weight. She is not ill-appearing, toxic-appearing or diaphoretic.  HENT:     Head: Normocephalic and atraumatic.     Right Ear: Tympanic membrane, ear canal and external ear normal. No drainage or tenderness. No middle ear effusion. There is no impacted cerumen. Tympanic  membrane is not erythematous or bulging.     Left Ear: Tympanic membrane, ear canal and external ear normal. No drainage or tenderness.  No middle ear effusion. There is no impacted cerumen. Tympanic membrane is not erythematous or bulging.     Nose: Congestion present. No rhinorrhea.     Mouth/Throat:     Mouth: Mucous membranes are moist. No oral lesions.     Pharynx: No pharyngeal swelling, oropharyngeal exudate, posterior oropharyngeal erythema or uvula swelling.     Tonsils: No tonsillar exudate or tonsillar abscesses.     Comments: Thick streaks of postnasal drainage overlying pharynx. Eyes:     General: No scleral icterus.       Right eye: No discharge.        Left eye: No discharge.     Extraocular Movements: Extraocular movements intact.     Right eye: Normal extraocular motion.     Left eye: Normal extraocular motion.     Conjunctiva/sclera: Conjunctivae normal.  Cardiovascular:     Rate and Rhythm: Normal rate and regular rhythm.     Heart sounds: Normal heart sounds. No murmur heard.    No friction rub. No gallop.  Pulmonary:     Effort: Pulmonary effort is normal. No respiratory distress.     Breath sounds: No stridor. No wheezing, rhonchi or rales.  Chest:     Chest wall: No tenderness.  Musculoskeletal:     Cervical back: Normal range of motion and neck supple.  Lymphadenopathy:     Cervical: No cervical adenopathy.  Skin:    General: Skin is warm and dry.  Neurological:     General: No focal deficit present.     Mental Status: She is alert and oriented to person, place, and time.  Psychiatric:        Mood and Affect: Mood normal.        Behavior: Behavior normal.     Assessment and Plan :   PDMP not reviewed this encounter.  1. Viral respiratory illness   2. Nasal congestion   3. [redacted] weeks gestation of pregnancy   4. Subacute cough   5. Mild persistent asthma without complication     Deferred imaging given her pregnancy.  In the context of her asthma  and respiratory symptoms, I offered her oral prednisone at 20 mg once daily for 5 days.  I did a lower dose given her pregnancy and lack of overt wheezing on her lung exam.  I emphasized need to discuss this with her OB/GYN to make sure that they are okay with her taking the prednisone.  Otherwise recommended supportive care for a viral respiratory illness.  Patient declined COVID test. Counseled patient on potential for adverse effects with medications prescribed/recommended today, ER and return-to-clinic precautions discussed, patient verbalized understanding.    10/23/2021, PA-C  04/12/22 1957  

## 2022-04-13 ENCOUNTER — Encounter: Payer: Self-pay | Admitting: Certified Nurse Midwife

## 2022-04-13 ENCOUNTER — Encounter: Payer: Self-pay | Admitting: Obstetrics

## 2022-05-03 ENCOUNTER — Ambulatory Visit (INDEPENDENT_AMBULATORY_CARE_PROVIDER_SITE_OTHER): Payer: Medicaid Other | Admitting: Certified Nurse Midwife

## 2022-05-03 VITALS — BP 114/78 | HR 90 | Wt 234.1 lb

## 2022-05-03 DIAGNOSIS — Z3482 Encounter for supervision of other normal pregnancy, second trimester: Secondary | ICD-10-CM

## 2022-05-03 DIAGNOSIS — Z3A24 24 weeks gestation of pregnancy: Secondary | ICD-10-CM

## 2022-05-03 LAB — POCT URINALYSIS DIPSTICK OB
Bilirubin, UA: NEGATIVE
Blood, UA: NEGATIVE
Glucose, UA: NEGATIVE
Ketones, UA: NEGATIVE
Leukocytes, UA: NEGATIVE
Nitrite, UA: NEGATIVE
Spec Grav, UA: 1.015 (ref 1.010–1.025)
Urobilinogen, UA: 0.2 E.U./dL
pH, UA: 6 (ref 5.0–8.0)

## 2022-05-03 NOTE — Progress Notes (Signed)
ROB doing well. Discussed Glucose screen next visit. Handout given with instruction on eating prior to testing. She is feeling fetal movement. Discussed u/s for completion of anatomy. Pt states that she has not been contacted about scheduling. Pt to schedule today a check out. Follow up 4 wks for glucose screen and ROB.   Doreene Burke, CNM

## 2022-05-03 NOTE — Patient Instructions (Signed)
Oral Glucose Tolerance Test During Pregnancy Why am I having this test? The oral glucose tolerance test (OGTT) is done to check how your body processes blood sugar (glucose). This is one of several tests used to diagnose diabetes that develops during pregnancy (gestational diabetes mellitus). Gestational diabetes is a short-term form of diabetes that some women develop while they are pregnant. It usually occurs during the second trimester of pregnancy and goes away after delivery. Testing, or screening, for gestational diabetes usually occurs at weeks 24-28 of pregnancy. You may have the OGTT test after having a 1-hour glucose screening test if the results from that test indicate that you may have gestational diabetes. This test may also be needed if: You have a history of gestational diabetes. There is a history of giving birth to very large babies or of losing pregnancies (having stillbirths). You have signs and symptoms of diabetes, such as: Changes in your eyesight. Tingling or numbness in your hands or feet. Changes in hunger, thirst, and urination, and these are not explained by your pregnancy. What is being tested? This test measures the amount of glucose in your blood at different times during a period of 3 hours. This shows how well your body can process glucose. What kind of sample is taken?  Blood samples are required for this test. They are usually collected by inserting a needle into a blood vessel. How do I prepare for this test? For 3 days before your test, eat normally. Have plenty of carbohydrate-rich foods. Follow instructions from your health care provider about: Eating or drinking restrictions on the day of the test. You may be asked not to eat or drink anything other than water (to fast) starting 8-10 hours before the test. Changing or stopping your regular medicines. Some medicines may interfere with this test. Tell a health care provider about: All medicines you are  taking, including vitamins, herbs, eye drops, creams, and over-the-counter medicines. Any blood disorders you have. Any surgeries you have had. Any medical conditions you have. What happens during the test? First, your blood glucose will be measured. This is referred to as your fasting blood glucose because you fasted before the test. Then, you will drink a glucose solution that contains a certain amount of glucose. Your blood glucose will be measured again 1, 2, and 3 hours after you drink the solution. This test takes about 3 hours to complete. You will need to stay at the testing location during this time. During the testing period: Do not eat or drink anything other than the glucose solution. Do not exercise. Do not use any products that contain nicotine or tobacco, such as cigarettes, e-cigarettes, and chewing tobacco. These can affect your test results. If you need help quitting, ask your health care provider. The testing procedure may vary among health care providers and hospitals. How are the results reported? Your results will be reported as milligrams of glucose per deciliter of blood (mg/dL) or millimoles per liter (mmol/L). There is more than one source for screening and diagnosis reference values used to diagnose gestational diabetes. Your health care provider will compare your results to normal values that were established after testing a large group of people (reference values). Reference values may vary among labs and hospitals. For this test ([facility]arp[facility]-Coustan), reference values are: Fasting: 95 mg/dL (5.3 mmol/L). 1 hour: 180 mg/dL (10.0 mmol/L). 2 hour: 155 mg/dL (8.6 mmol/L). 3 hour: 140 mg/dL (7.8 mmol/L). What do the results mean? Results below the reference values are   considered normal. If two or more of your blood glucose levels are at or above the reference values, you may be diagnosed with gestational diabetes. If only one level is high, your health care provider may  suggest repeat testing or other tests to confirm a diagnosis. Talk with your health care provider about what your results mean. Questions to ask your health care provider Ask your health care provider, or the department that is doing the test: When will my results be ready? How will I get my results? What are my treatment options? What other tests do I need? What are my next steps? Summary The oral glucose tolerance test (OGTT) is one of several tests used to diagnose diabetes that develops during pregnancy (gestational diabetes mellitus). Gestational diabetes is a short-term form of diabetes that some women develop while they are pregnant. You may have the OGTT test after having a 1-hour glucose screening test if the results from that test show that you may have gestational diabetes. You may also have this test if you have any symptoms or risk factors for this type of diabetes. Talk with your health care provider about what your results mean. This information is not intended to replace advice given to you by your health care provider. Make sure you discuss any questions you have with your health care provider. Document Revised: 12/29/2021 Document Reviewed: 11/01/2019 Elsevier Patient Education  2023 Elsevier Inc.  

## 2022-05-05 ENCOUNTER — Other Ambulatory Visit: Payer: Self-pay | Admitting: Certified Nurse Midwife

## 2022-05-05 ENCOUNTER — Telehealth: Payer: Self-pay

## 2022-05-05 ENCOUNTER — Encounter: Payer: Self-pay | Admitting: Certified Nurse Midwife

## 2022-05-05 DIAGNOSIS — Z3A24 24 weeks gestation of pregnancy: Secondary | ICD-10-CM

## 2022-05-05 NOTE — Telephone Encounter (Signed)
Patient contacted office requesting to be scheduled for U/S. Patient was last seen in office 05/03/22, according to her note it reads; ROB doing well. Discussed Glucose screen next visit. Handout given with instruction on eating prior to testing. She is feeling fetal movement. Discussed u/s for completion of anatomy. Pt states that she has not been contacted about scheduling. Pt to schedule today a check out. Follow up 4 wks for glucose screen and ROB.   I do not see order in chart and when I try to place order for Anatomy U/S it states [facility]FM, I do not know if this is correct order that is needed. Pattricia Boss will you please review and place order. [facility], will you please reach out to patient and schedule? Thanks. KW

## 2022-05-06 NOTE — Telephone Encounter (Signed)
I offered patient tomorrow 12/1 for ultrasound. Patient declined. I will check availability with ultrasound tech to see when we can work her in. Patient aware.

## 2022-05-06 NOTE — Telephone Encounter (Signed)
I contacted patient via phone x 2. I left voicemail for patient to call back to be scheduled.  We were going to offer tomorrow 05/07/22 because of openings.

## 2022-05-06 NOTE — Telephone Encounter (Signed)
I contacted patient via phone. Patient is scheduled for 05/10/22 for anatomy f/u

## 2022-05-10 ENCOUNTER — Ambulatory Visit (INDEPENDENT_AMBULATORY_CARE_PROVIDER_SITE_OTHER): Payer: Medicaid Other

## 2022-05-10 DIAGNOSIS — Z362 Encounter for other antenatal screening follow-up: Secondary | ICD-10-CM | POA: Diagnosis not present

## 2022-05-10 DIAGNOSIS — Z3A24 24 weeks gestation of pregnancy: Secondary | ICD-10-CM

## 2022-06-02 ENCOUNTER — Other Ambulatory Visit: Payer: Self-pay

## 2022-06-02 DIAGNOSIS — Z113 Encounter for screening for infections with a predominantly sexual mode of transmission: Secondary | ICD-10-CM

## 2022-06-02 DIAGNOSIS — Z131 Encounter for screening for diabetes mellitus: Secondary | ICD-10-CM

## 2022-06-02 DIAGNOSIS — Z3480 Encounter for supervision of other normal pregnancy, unspecified trimester: Secondary | ICD-10-CM

## 2022-06-02 NOTE — Progress Notes (Signed)
28 week lab orders placed.

## 2022-06-03 ENCOUNTER — Ambulatory Visit (INDEPENDENT_AMBULATORY_CARE_PROVIDER_SITE_OTHER): Payer: Medicaid Other | Admitting: Certified Nurse Midwife

## 2022-06-03 ENCOUNTER — Other Ambulatory Visit: Payer: Medicaid Other

## 2022-06-03 VITALS — BP 102/70 | HR 89 | Wt 242.4 lb

## 2022-06-03 DIAGNOSIS — Z3A29 29 weeks gestation of pregnancy: Secondary | ICD-10-CM

## 2022-06-03 DIAGNOSIS — Z23 Encounter for immunization: Secondary | ICD-10-CM | POA: Diagnosis not present

## 2022-06-03 DIAGNOSIS — Z3483 Encounter for supervision of other normal pregnancy, third trimester: Secondary | ICD-10-CM

## 2022-06-03 NOTE — Patient Instructions (Signed)
Oral Glucose Tolerance Test During Pregnancy Why am I having this test? The oral glucose tolerance test (OGTT) is done to check how your body processes blood sugar (glucose). This is one of several tests used to diagnose diabetes that develops during pregnancy (gestational diabetes mellitus). Gestational diabetes is a short-term form of diabetes that some women develop while they are pregnant. It usually occurs during the second trimester of pregnancy and goes away after delivery. Testing, or screening, for gestational diabetes usually occurs at weeks 24-28 of pregnancy. You may have the OGTT test after having a 1-hour glucose screening test if the results from that test indicate that you may have gestational diabetes. This test may also be needed if: You have a history of gestational diabetes. There is a history of giving birth to very large babies or of losing pregnancies (having stillbirths). You have signs and symptoms of diabetes, such as: Changes in your eyesight. Tingling or numbness in your hands or feet. Changes in hunger, thirst, and urination, and these are not explained by your pregnancy. What is being tested? This test measures the amount of glucose in your blood at different times during a period of 3 hours. This shows how well your body can process glucose. What kind of sample is taken?  Blood samples are required for this test. They are usually collected by inserting a needle into a blood vessel. How do I prepare for this test? For 3 days before your test, eat normally. Have plenty of carbohydrate-rich foods. Follow instructions from your health care provider about: Eating or drinking restrictions on the day of the test. You may be asked not to eat or drink anything other than water (to fast) starting 8-10 hours before the test. Changing or stopping your regular medicines. Some medicines may interfere with this test. Tell a health care provider about: All medicines you are  taking, including vitamins, herbs, eye drops, creams, and over-the-counter medicines. Any blood disorders you have. Any surgeries you have had. Any medical conditions you have. What happens during the test? First, your blood glucose will be measured. This is referred to as your fasting blood glucose because you fasted before the test. Then, you will drink a glucose solution that contains a certain amount of glucose. Your blood glucose will be measured again 1, 2, and 3 hours after you drink the solution. This test takes about 3 hours to complete. You will need to stay at the testing location during this time. During the testing period: Do not eat or drink anything other than the glucose solution. Do not exercise. Do not use any products that contain nicotine or tobacco, such as cigarettes, e-cigarettes, and chewing tobacco. These can affect your test results. If you need help quitting, ask your health care provider. The testing procedure may vary among health care providers and hospitals. How are the results reported? Your results will be reported as milligrams of glucose per deciliter of blood (mg/dL) or millimoles per liter (mmol/L). There is more than one source for screening and diagnosis reference values used to diagnose gestational diabetes. Your health care provider will compare your results to normal values that were established after testing a large group of people (reference values). Reference values may vary among labs and hospitals. For this test ([facility]arp[facility]-Coustan), reference values are: Fasting: 95 mg/dL (5.3 mmol/L). 1 hour: 180 mg/dL (10.0 mmol/L). 2 hour: 155 mg/dL (8.6 mmol/L). 3 hour: 140 mg/dL (7.8 mmol/L). What do the results mean? Results below the reference values are   considered normal. If two or more of your blood glucose levels are at or above the reference values, you may be diagnosed with gestational diabetes. If only one level is high, your health care provider may  suggest repeat testing or other tests to confirm a diagnosis. Talk with your health care provider about what your results mean. Questions to ask your health care provider Ask your health care provider, or the department that is doing the test: When will my results be ready? How will I get my results? What are my treatment options? What other tests do I need? What are my next steps? Summary The oral glucose tolerance test (OGTT) is one of several tests used to diagnose diabetes that develops during pregnancy (gestational diabetes mellitus). Gestational diabetes is a short-term form of diabetes that some women develop while they are pregnant. You may have the OGTT test after having a 1-hour glucose screening test if the results from that test show that you may have gestational diabetes. You may also have this test if you have any symptoms or risk factors for this type of diabetes. Talk with your health care provider about what your results mean. This information is not intended to replace advice given to you by your health care provider. Make sure you discuss any questions you have with your health care provider. Document Revised: 12/29/2021 Document Reviewed: 11/01/2019 Elsevier Patient Education  2023 Elsevier Inc.  

## 2022-06-03 NOTE — Addendum Note (Signed)
Addended by: Fonda Kinder on: 06/03/2022 08:54 AM   Modules accepted: Orders

## 2022-06-03 NOTE — Progress Notes (Signed)
ROB doing well. Feels good movement. 28 wk labs today: Glucose screen/RPR/CBC. Tdap done, Blood transfusion consent completed, all questions answered. Sample birth plan given, will follow up in upcoming visits. Discussed birth control after delivery, information pamphlet given. She has done depo in the past and will likely use again.   Follow up 2 wk  for ROB or sooner if needed.    Doreene Burke, CNM

## 2022-06-04 ENCOUNTER — Encounter: Payer: Self-pay | Admitting: Certified Nurse Midwife

## 2022-06-04 LAB — 28 WEEK RH+PANEL
Basophils Absolute: 0 10*3/uL (ref 0.0–0.2)
Basos: 0 %
EOS (ABSOLUTE): 0.2 10*3/uL (ref 0.0–0.4)
Eos: 2 %
Gestational Diabetes Screen: 121 mg/dL (ref 70–139)
HIV Screen 4th Generation wRfx: NONREACTIVE
Hematocrit: 32.3 % — ABNORMAL LOW (ref 34.0–46.6)
Hemoglobin: 10.8 g/dL — ABNORMAL LOW (ref 11.1–15.9)
Immature Grans (Abs): 0 10*3/uL (ref 0.0–0.1)
Immature Granulocytes: 1 %
Lymphocytes Absolute: 1.5 10*3/uL (ref 0.7–3.1)
Lymphs: 22 %
MCH: 30.9 pg (ref 26.6–33.0)
MCHC: 33.4 g/dL (ref 31.5–35.7)
MCV: 93 fL (ref 79–97)
Monocytes Absolute: 0.2 10*3/uL (ref 0.1–0.9)
Monocytes: 3 %
Neutrophils Absolute: 4.9 10*3/uL (ref 1.4–7.0)
Neutrophils: 72 %
Platelets: 239 10*3/uL (ref 150–450)
RBC: 3.49 x10E6/uL — ABNORMAL LOW (ref 3.77–5.28)
RDW: 13 % (ref 11.7–15.4)
RPR Ser Ql: NONREACTIVE
WBC: 6.8 10*3/uL (ref 3.4–10.8)

## 2022-06-07 NOTE — L&D Delivery Note (Signed)
Date of delivery: 07/11/2022 Estimated Date of Delivery: 08/19/22 Patient's last menstrual period was 10/21/2021 (approximate). EGA: [redacted]w[redacted]d  Delivery Note At 12:43 AM a viable female was delivered via Vaginal, Spontaneous  Presentation: Occiput Anterior, LOA APGAR: 8, 8; weight 4 lb 13.6 oz (2200 g).   Placenta status: Spontaneous, Intact.   Cord: 3 vessels with the following complications: None.  Cord pH: NA  Called to see patient.  Mom pushed well to deliver a viable female infant.  The head followed by shoulders, which delivered without difficulty, and the rest of the body.  No nuchal cord noted.  Baby to mom's chest.  Cord clamped and cut after 3 min delay.  No cord blood obtained. Newborn taken to warmer in the care of NNP for assessment given prematurity. Newborn to [facility] for further transition care.  Placenta delivered spontaneously, intact, with a 3-vessel cord.   All counts correct.  Hemostasis obtained with IV pitocin and fundal massage.     Anesthesia: Epidural Episiotomy: none  Lacerations: none Suture Repair:  NA Est. Blood Loss (mL): 150 ml   Mom to postpartum.  Baby to NICU.   Rod Can, CNM 07/11/2022, 1:23 AM

## 2022-06-08 ENCOUNTER — Encounter: Payer: Self-pay | Admitting: Certified Nurse Midwife

## 2022-06-08 ENCOUNTER — Other Ambulatory Visit: Payer: Self-pay | Admitting: Certified Nurse Midwife

## 2022-06-08 MED ORDER — FUSION PLUS PO CAPS: 1.0000 | ORAL_CAPSULE | Freq: Every day | ORAL | 6 refills | Status: AC

## 2022-06-17 ENCOUNTER — Encounter: Payer: Self-pay | Admitting: Obstetrics and Gynecology

## 2022-06-17 ENCOUNTER — Ambulatory Visit (INDEPENDENT_AMBULATORY_CARE_PROVIDER_SITE_OTHER): Payer: Medicaid Other | Admitting: Obstetrics and Gynecology

## 2022-06-17 VITALS — BP 116/79 | HR 98 | Wt 242.0 lb

## 2022-06-17 DIAGNOSIS — Z3480 Encounter for supervision of other normal pregnancy, unspecified trimester: Secondary | ICD-10-CM

## 2022-06-17 DIAGNOSIS — Z3A31 31 weeks gestation of pregnancy: Secondary | ICD-10-CM

## 2022-06-17 DIAGNOSIS — Z3483 Encounter for supervision of other normal pregnancy, third trimester: Secondary | ICD-10-CM

## 2022-06-17 LAB — POCT URINALYSIS DIPSTICK OB
Bilirubin, UA: NEGATIVE
Blood, UA: NEGATIVE
Glucose, UA: NEGATIVE
Ketones, UA: NEGATIVE
Leukocytes, UA: NEGATIVE
Nitrite, UA: NEGATIVE
POC,PROTEIN,UA: NEGATIVE
Spec Grav, UA: 1.015 (ref 1.010–1.025)
Urobilinogen, UA: 0.2 E.U./dL
pH, UA: 6.5 (ref 5.0–8.0)

## 2022-06-17 NOTE — Progress Notes (Signed)
ROB: She has no complaints feels well.   TOLAC versus repeat cesarean delivery discussed.  risks of [place] versus cesarean discussed.  All questions answered.  She has not yet decided upon Tolac versus repeat cesarean.  I have asked her to inform us by her next visit so that we may schedule cesarean or plan accordingly for TOLAC.

## 2022-06-17 NOTE — Progress Notes (Signed)
ROB. Patient states daily fetal movement. She states she would like to TOLAC this pregnancy.  She states no questions or concerns at this time.

## 2022-07-01 ENCOUNTER — Ambulatory Visit (INDEPENDENT_AMBULATORY_CARE_PROVIDER_SITE_OTHER): Payer: Medicaid Other | Admitting: Advanced Practice Midwife

## 2022-07-01 ENCOUNTER — Encounter: Payer: Self-pay | Admitting: Advanced Practice Midwife

## 2022-07-01 VITALS — BP 120/80 | Wt 247.0 lb

## 2022-07-01 DIAGNOSIS — Z3483 Encounter for supervision of other normal pregnancy, third trimester: Secondary | ICD-10-CM

## 2022-07-01 DIAGNOSIS — Z3A33 33 weeks gestation of pregnancy: Secondary | ICD-10-CM

## 2022-07-01 NOTE — Progress Notes (Signed)
Routine Prenatal Care Visit  Subjective  Brandi Solomon is a 32 y.o. [id number] at [redacted]w[redacted]d being seen today for ongoing prenatal care.  She is currently monitored for the following issues for this low-risk pregnancy and has Depression; Asthma; Former smoker; History of herpes genitalis; PROM (premature rupture of membranes); History of 2 cesarean sections; and Supervision of other normal pregnancy, antepartum on their problem list.  ----------------------------------------------------------------------------------- Patient reports  some pelvic pressure .  She reviewed risks/benefits of VBAC with Dr Amalia Hailey at last visit and doesn't remember signing a consent.  Contractions: Not present. Vag. Bleeding: None.  Movement: Present. Leaking Fluid denies.  ----------------------------------------------------------------------------------- The following portions of the patient's history were reviewed and updated as appropriate: allergies, current medications, past family history, past medical history, past social history, past surgical history and problem list. Problem list updated.  Objective  Blood pressure 120/80, weight 247 lb (112 kg), last menstrual period 10/21/2021. Pregravid weight 197 lb (89.4 kg) Total Weight Gain 50 lb (22.7 kg) Urinalysis: Urine Protein    Urine Glucose    Fetal Status: Fetal Heart Rate (bpm): 136 Fundal Height: 33 cm Movement: Present     General:  Alert, oriented and cooperative. Patient is in no acute distress.  Skin: Skin is warm and dry. No rash noted.   Cardiovascular: Normal heart rate noted  Respiratory: Normal respiratory effort, no problems with respiration noted  Abdomen: Soft, gravid, appropriate for gestational age. Pain/Pressure: Present     Pelvic:  Cervical exam deferred        Extremities: Normal range of motion.  Edema: None  Mental Status: Normal mood and affect. Normal behavior. Normal judgment and thought content.   Assessment   32 y.o. [id number] at  [redacted]w[redacted]d by  08/19/2022, by Ultrasound presenting for routine prenatal visit  Plan   fifth Problems (from 01/01/22 to present)     Problem Noted Resolved   Supervision of other normal pregnancy, antepartum 01/01/2022 by Cleophas Dunker, CMA No   Overview Addendum 07/01/2022  3:12 PM by Rod Can, CNM     Nursing Staff Provider  Office Location  [facility]  By 9w u/s  Language  English Anatomy US  Incomplete- repeat ordered.  Flu Vaccine  declined Genetic Screen  NIPS:   TDaP vaccine   12/28 Hgb A1C or  GTT Early : Third trimester :   Covid    LAB RESULTS   Rhogam   Blood Type A/Positive/-- (08/01 0940)   Feeding Plan  Antibody Negative (08/01 0940)  Contraception  Rubella 1.47 (08/01 0940)  Circumcision  RPR Non Reactive (08/01 0940)   Pediatrician   HBsAg Negative (08/01 0940)   Support Person  HIV Non Reactive (08/01 0940)  Prenatal Classes  Varicella Immune     GBS  (For PCN allergy, check sensitivities)   BTL Consent     VBAC Consent  Pap  NILM    Hgb Electro      CF      SMA                   Preterm labor symptoms and general obstetric precautions including but not limited to vaginal bleeding, contractions, leaking of fluid and fetal movement were reviewed in detail with the patient. Please refer to After Visit Summary for other counseling recommendations.   Return in about 2 weeks (around 07/15/2022) for rob.  Rod Can, CNM 07/01/2022 3:24 PM

## 2022-07-01 NOTE — Patient Instructions (Signed)
Vaginal Birth After Cesarean Delivery  Vaginal birth after cesarean delivery (VBAC) means giving birth vaginally after previously delivering a baby through a cesarean section, or C-section. VBAC may be a safe option for you, depending on your health and other factors. Discuss VBAC with your health care provider early in your pregnancy, so you can understand the risks, benefits, and options. Having these discussions early will give you time to make your birth plan. What increases the chances for a successful VBAC? These factors increase your chances of a successful VBAC: You have had only one previous C-section. You had a low transverse incision for your C-section. You have had a successful vaginal birth. Your labor starts naturally on or before your due date. You and your baby have had a healthy pregnancy. Your baby is head-down. What happens when I arrive at the birth center or hospital? Once you are in labor and have been admitted into the hospital or birth center, your health care team may: Review your pregnancy history and go over any concerns you have. Talk with you about your birth plan and discuss pain control options. Check your blood pressure, breathing rate, and heart rate. Check your baby's heartbeat. Monitor your uterus for contractions. Check whether your bag of water (amniotic sac) has broken (ruptured). Insert an IV into one of your veins. You may get fluids and medicine through the IV. Monitoring and exams Your health care team may check your contractions (uterine monitoring) and your baby's heart rate (fetal monitoring). You may need to be monitored for long periods at a time (continuously) with a monitoring device. Your health care provider may also do frequent physical exams. These may include checking how and where your baby is positioned in your uterus and checking your cervix to determine whether it is opening up (dilating) or thinning out (effacing). What happens during  labor and delivery? Normal labor and delivery are divided into the following three stages: Stage 1 This is the longest stage of labor. Throughout this stage, you will feel contractions. Contractions are generally mild, infrequent, and irregular at first. They get stronger, more frequent (about every 2-3 minutes), and more regular as you move through this stage. The first stage ends when your cervix is completely dilated to 4 inches (10 cm) and effaced. Stage 2 This stage starts once your cervix is completely dilated and effaced and lasts until you deliver your baby. In this stage, you will feel the urge to push your baby out of your vagina. You may feel stretching and burning pain, especially when the widest part of your baby's head passes through the vaginal opening (crowning). Once your baby is delivered, the umbilical cord will be clamped and cut. Your baby will be placed on your bare chest and covered with a warm blanket. If you are planning to breastfeed, watch your baby for feeding cues, like rooting or sucking. Stage 3 This stage starts immediately after your baby is born and ends after you deliver the placenta. This stage may take anywhere from 5 to 30 minutes. What can I expect after labor and delivery? After labor is over, you and your baby will be checked to make sure you are both healthy, and you will both be monitored until you are ready to go home. You may be encouraged to stay in the same room with your baby to promote early bonding and successful breastfeeding. Your uterus will be checked and massaged regularly (fundal massage). You may continue to receive fluids and medicines  through an IV. You will have some soreness and pain in your abdomen, vagina, and the area of skin between your vaginal opening and your anus (perineum). If an incision was made near your vagina (episiotomy) or if you had some vaginal tearing during delivery, cold compresses may be used to reduce pain and  swelling. Follow these instructions at home: Incision care If you have an episiotomy incision, follow instructions from your health care provider about how to take care of your incision. Check your incision area every day for signs of infection. Check for: Redness, swelling, or pain. More fluid or blood. Warmth. Pus or a bad smell. If directed, put ice on the episiotomy area. To do this: Put ice in a plastic bag. Place a towel between your skin and the bag. Leave the ice on for 20 minutes, 2-3 times a day. Remove the ice if your skin turns bright red. This is very important. If you cannot feel pain, heat, or cold, you have a greater risk of damage to the area. Activity Return to your normal activities as told by your health care provider. Ask your health care provider what activities are safe for you. Avoid sitting for a long time without moving. Get up to take short walks every 1-2 hours. General instructions Take over-the-counter and prescription medicines only as told by your health care provider. Do not take baths, swim, or use a hot tub until your health care provider approves. Ask if you may take showers. You may only be allowed to take sponge baths. It is normal to have vaginal bleeding after delivery. Wear a sanitary pad for vaginal bleeding and discharge. Keep all follow-up visits. This is important. Where to find more information American Pregnancy Association: americanpregnancy.org [employer]: acog.org Summary Vaginal birth after cesarean delivery (VBAC) means giving birth vaginally after previously delivering a baby through a cesarean section, or C-section. Once you are in labor and have been admitted into the hospital or birth center, your health care team may review your pregnancy history and go over any concerns you have. Although most women are able to have a successful VBAC, sometimes it is necessary to have another  C-section. Keep all follow-up visits. This is important. This information is not intended to replace advice given to you by your health care provider. Make sure you discuss any questions you have with your health care provider. Document Revised: 05/22/2020 Document Reviewed: 05/22/2020 Elsevier Patient Education  [employer].

## 2022-07-08 ENCOUNTER — Other Ambulatory Visit: Payer: Self-pay

## 2022-07-08 ENCOUNTER — Encounter: Payer: Self-pay | Admitting: Obstetrics and Gynecology

## 2022-07-08 ENCOUNTER — Encounter: Payer: Self-pay | Admitting: Obstetrics

## 2022-07-08 ENCOUNTER — Inpatient Hospital Stay
Admission: EM | Admit: 2022-07-08 | Discharge: 2022-07-13 | DRG: 806 | Disposition: A | Discharge status: Home / Self Care | Payer: Medicaid Other | Attending: Certified Nurse Midwife | Admitting: Certified Nurse Midwife

## 2022-07-08 DIAGNOSIS — O34219 Maternal care for unspecified type scar from previous cesarean delivery: Secondary | ICD-10-CM | POA: Insufficient documentation

## 2022-07-08 DIAGNOSIS — O9832 Other infections with a predominantly sexual mode of transmission complicating childbirth: Secondary | ICD-10-CM | POA: Diagnosis present

## 2022-07-08 DIAGNOSIS — Z3480 Encounter for supervision of other normal pregnancy, unspecified trimester: Principal | ICD-10-CM

## 2022-07-08 DIAGNOSIS — O42913 Preterm premature rupture of membranes, unspecified as to length of time between rupture and onset of labor, third trimester: Principal | ICD-10-CM | POA: Diagnosis present

## 2022-07-08 DIAGNOSIS — O9882 Other maternal infectious and parasitic diseases complicating childbirth: Secondary | ICD-10-CM | POA: Diagnosis not present

## 2022-07-08 DIAGNOSIS — O34211 Maternal care for low transverse scar from previous cesarean delivery: Secondary | ICD-10-CM | POA: Diagnosis present

## 2022-07-08 DIAGNOSIS — B009 Herpesviral infection, unspecified: Secondary | ICD-10-CM | POA: Diagnosis not present

## 2022-07-08 DIAGNOSIS — O9952 Diseases of the respiratory system complicating childbirth: Secondary | ICD-10-CM | POA: Diagnosis present

## 2022-07-08 DIAGNOSIS — J45909 Unspecified asthma, uncomplicated: Secondary | ICD-10-CM | POA: Diagnosis present

## 2022-07-08 DIAGNOSIS — Z3A34 34 weeks gestation of pregnancy: Secondary | ICD-10-CM

## 2022-07-08 DIAGNOSIS — O9902 Anemia complicating childbirth: Secondary | ICD-10-CM | POA: Diagnosis present

## 2022-07-08 DIAGNOSIS — A6000 Herpesviral infection of urogenital system, unspecified: Secondary | ICD-10-CM | POA: Diagnosis present

## 2022-07-08 DIAGNOSIS — Z87891 Personal history of nicotine dependence: Secondary | ICD-10-CM | POA: Diagnosis not present

## 2022-07-08 DIAGNOSIS — O42113 Preterm premature rupture of membranes, onset of labor more than 24 hours following rupture, third trimester: Secondary | ICD-10-CM

## 2022-07-08 LAB — TYPE AND SCREEN
ABO/RH(D): A POS
Antibody Screen: NEGATIVE

## 2022-07-08 LAB — CBC
HCT: 32.3 % — ABNORMAL LOW (ref 36.0–46.0)
Hemoglobin: 10.8 g/dL — ABNORMAL LOW (ref 12.0–15.0)
MCH: 30 pg (ref 26.0–34.0)
MCHC: 33.4 g/dL (ref 30.0–36.0)
MCV: 89.7 fL (ref 80.0–100.0)
Platelets: 242 10*3/uL (ref 150–400)
RBC: 3.6 MIL/uL — ABNORMAL LOW (ref 3.87–5.11)
RDW: 13.4 % (ref 11.5–15.5)
WBC: 8.5 10*3/uL (ref 4.0–10.5)
nRBC: 0 % (ref 0.0–0.2)

## 2022-07-08 LAB — CHLAMYDIA/NGC RT PCR (ARMC ONLY)
Chlamydia Tr: NOT DETECTED
N gonorrhoeae: NOT DETECTED

## 2022-07-08 LAB — GROUP B STREP BY PCR: Group B strep by PCR: NEGATIVE

## 2022-07-08 LAB — RUPTURE OF MEMBRANE (ROM)PLUS: Rom Plus: POSITIVE

## 2022-07-08 MED ORDER — LIDOCAINE HCL (PF) 1 % IJ SOLN: 30.0000 mL | INTRAMUSCULAR | Status: DC | PRN

## 2022-07-08 MED ORDER — LACTATED RINGERS IV SOLN
500.0000 mL | INTRAVENOUS | Status: DC | PRN
  Administered 2022-07-10: 500 mL via INTRAVENOUS

## 2022-07-08 MED ORDER — OXYTOCIN 10 UNIT/ML IJ SOLN
INTRAMUSCULAR | Status: AC
  Filled 2022-07-08: qty 2

## 2022-07-08 MED ORDER — BETAMETHASONE SOD PHOS & ACET 6 (3-3) MG/ML IJ SUSP
12.0000 mg | Freq: Two times a day (BID) | INTRAMUSCULAR | Status: AC
  Administered 2022-07-08: 12 mg via INTRAMUSCULAR
  Filled 2022-07-08: qty 5

## 2022-07-08 MED ORDER — ZOLPIDEM TARTRATE 5 MG PO TABS
5.0000 mg | ORAL_TABLET | Freq: Every evening | ORAL | Status: DC | PRN
  Administered 2022-07-08 – 2022-07-09 (×2): 5 mg via ORAL
  Filled 2022-07-08 (×2): qty 1

## 2022-07-08 MED ORDER — SOD CITRATE-CITRIC ACID 500-334 MG/5ML PO SOLN: 30.0000 mL | ORAL | Status: DC | PRN

## 2022-07-08 MED ORDER — LIDOCAINE HCL (PF) 1 % IJ SOLN
INTRAMUSCULAR | Status: AC
  Filled 2022-07-08: qty 30

## 2022-07-08 MED ORDER — MISOPROSTOL 200 MCG PO TABS
ORAL_TABLET | ORAL | Status: AC
  Filled 2022-07-08: qty 4

## 2022-07-08 MED ORDER — OXYTOCIN-SODIUM CHLORIDE 30-0.9 UT/500ML-% IV SOLN: 2.5000 [IU]/h | INTRAVENOUS | Status: DC

## 2022-07-08 MED ORDER — AMMONIA AROMATIC IN INHA
RESPIRATORY_TRACT | Status: AC
  Filled 2022-07-08: qty 10

## 2022-07-08 MED ORDER — FENTANYL CITRATE (PF) 100 MCG/2ML IJ SOLN
50.0000 ug | INTRAMUSCULAR | Status: DC | PRN
  Administered 2022-07-10: 100 ug via INTRAVENOUS
  Filled 2022-07-08: qty 2

## 2022-07-08 MED ORDER — OXYTOCIN BOLUS FROM INFUSION
333.0000 mL | Freq: Once | INTRAVENOUS | Status: AC
  Administered 2022-07-11: 333 mL via INTRAVENOUS

## 2022-07-08 MED ORDER — VALACYCLOVIR HCL 500 MG PO TABS
500.0000 mg | ORAL_TABLET | Freq: Every day | ORAL | Status: DC
  Administered 2022-07-08 – 2022-07-10 (×3): 500 mg via ORAL
  Filled 2022-07-08 (×3): qty 1

## 2022-07-08 MED ORDER — ACETAMINOPHEN 325 MG PO TABS: 650.0000 mg | ORAL_TABLET | ORAL | Status: DC | PRN

## 2022-07-08 MED ORDER — ONDANSETRON HCL 4 MG/2ML IJ SOLN: 4.0000 mg | Freq: Four times a day (QID) | INTRAMUSCULAR | Status: DC | PRN

## 2022-07-08 MED ORDER — HYDROXYZINE HCL 25 MG PO TABS: 50.0000 mg | ORAL_TABLET | Freq: Four times a day (QID) | ORAL | Status: DC | PRN

## 2022-07-08 MED ORDER — OXYTOCIN-SODIUM CHLORIDE 30-0.9 UT/500ML-% IV SOLN
INTRAVENOUS | Status: AC
  Administered 2022-07-09: 1 m[IU]/min via INTRAVENOUS
  Filled 2022-07-08: qty 500

## 2022-07-08 MED ORDER — BETAMETHASONE SOD PHOS & ACET 6 (3-3) MG/ML IJ SUSP
12.0000 mg | INTRAMUSCULAR | Status: DC
  Administered 2022-07-08: 12 mg via INTRAMUSCULAR
  Filled 2022-07-08: qty 5

## 2022-07-08 NOTE — Telephone Encounter (Signed)
Pt is currently admitted at hospital

## 2022-07-08 NOTE — OB Triage Note (Signed)
Pt [id number] at [place] presents for leaking of fluid. Pt reports after going to the bathroom around 1am she continued to leak clear watery fluid. Pt reports a couple hours later feeling clear watery fluid running down her leg. Pt reports she has been wearing a pad and has changed it once. Reports +FM. Denies bleeding or pain. Reports csection for breech and fetal intolerance. Plans TOLAC. VSS. Monitors applied.

## 2022-07-08 NOTE — H&P (Addendum)
History and Physical   HPI  Brandi Solomon is a 32 y.o. [id number] at [redacted]w[redacted]d Estimated Date of Delivery: 08/19/22 who is being admitted for PPROM. She reports having a gush of fluid this morning and has been continuing to leak fluid. ROM Plus is positive. She denies ctx and vaginal bleeding and endorses good fetal movement. She has a h/o 2 cesarean births and would like to have a TOLAC. 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  OB History  OB History  Gravida Para Term Preterm AB Living  5 2 2  0 2 2  SAB IAB Ectopic Multiple Live Births  2 0 0 0 2    # Outcome Date GA Lbr Len/2nd Weight Sex Delivery Anes PTL Lv  5 Current           4 Term 07/23/19 [redacted]w[redacted]d  3200 g F CS-LTranv EPI  LIV     Name: [facility]     Apgar1: 5  Apgar5: 9  3 Term 08/01/08 [redacted]w[redacted]d   F CS-LTranv   LIV     Birth Comments: cesarean for breech     Name: Gabriella   2 SAB           1 SAB             PROBLEM LIST  Pregnancy complications or risks: Patient Active Problem List   Diagnosis Date Noted   Labor and delivery, indication for care 07/08/2022   Supervision of other normal pregnancy, antepartum 01/01/2022   History of 2 cesarean sections 07/23/2019   PROM (premature rupture of membranes) 07/22/2019   History of herpes genitalis 07/05/2019   Asthma 03/26/2019   Former smoker 03/26/2019   Depression 01/05/2019    Prenatal labs and studies: ABO, Rh: A/Positive/-- (08/01 0940) Antibody: Negative (08/01 0940) Rubella: 1.47 (08/01 0940) RPR: Non Reactive (12/28 0918)  HBsAg: Negative (08/01 0940)  HIV: Non Reactive (12/28 0918)  GBS: collected   Past Medical History:  Diagnosis Date   Asthma    Back pain affecting pregnancy in third trimester 06/27/2019   Delivery of pregnancy by cesarean section 07/25/2019   Depression affecting pregnancy    Elevated blood pressure affecting pregnancy, antepartum 01/19/2019   Labile - pt attributes to anxiety  No h/o meds  Offered baby  aspirin  Consider baseline labs   Failed trial of labor, delivered, current hospitalization 07/25/2019   Fetal intolerance to labor, delivered, current hospitalization 07/25/2019   Obesity (BMI 30-39.9)    Obesity affecting pregnancy 03/26/2019   Advised to limit weight gain   Postpartum care following cesarean delivery 07/25/2019   Previous cesarean delivery, antepartum condition or complication 15/40/0867   Done for breech Pt desires TOLAC   MFMU calculator 66% success   [email or web address] Predicted chance of vaginal birth after cesarean:  58.3% 95% confidence interval:  [54.0%, 62.5%]   S/P C-section 07/23/2019   Supervision of other normal pregnancy, antepartum 01/05/2019     [facility] Prenatal Labs Dating  12 wk [place] Blood type: A/Positive/-- (08/14 1611)  Genetic Screen NIPS: Negative XX Antibody:Negative (08/14 1611) Anatomic [place] incomplete Rubella: 2.18 (08/14 1611) Varicella: Immune GTT  28 wk:    116  RPR: Non Reactive (08/14 1611)  Rhogam  not needed HBsAg: Negative (08/14 1611)  TDaP vaccine  05/24/2019                     HIV: Non Reactive (08/14 1611)  F  Past Surgical History:  Procedure Laterality Date   CESAREAN SECTION     CESAREAN SECTION N/A 07/23/2019   Procedure: CESAREAN SECTION;  Surgeon: Gae Dry, MD;  Location: [facility] ORS;  Service: Obstetrics;  Laterality: N/A;     Medications    Current Discharge Medication List     CONTINUE these medications which have NOT CHANGED   Details  Iron-FA-B Cmp-C-Biot-Probiotic (FUSION PLUS) CAPS Take 1 tablet by mouth daily. Qty: 30 capsule, Refills: 6    Prenatal Vit-Fe Fumarate-FA (MULTIVITAMIN-PRENATAL) 27-0.8 MG TABS tablet Take 2 tablets by mouth daily. Gummie    albuterol (VENTOLIN HFA) 108 (90 Base) MCG/ACT inhaler Inhale 1-2 puffs into the lungs every 6 (six) hours as needed for wheezing or shortness of breath. Qty: 18 g, Refills: 0          Allergies  Patient has no known allergies.  Review of Systems  Pertinent items noted in HPI and remainder of comprehensive ROS otherwise negative.  Physical Exam  BP (!) 126/91 (BP Location: Right Arm)   Pulse 99   Temp 98.8 F (37.1 C) (Oral)   Resp 17   Ht 5\' 11"  (1.803 m)   Wt 112 kg   LMP 10/21/2021 (Approximate)   BMI 34.45 kg/m   Lungs:  CTAB Cardio: RRR without M/R/G Abd: Soft, gravid, NT Presentation: cephalic External genitalia: no s/s of HSV lesions Cervical exam deferred  See Prenatal records for more detailed PE.     FHR:  Baseline: 150 Variability: moderate Accelerations: present Decelerations: none Toco: none Category 1  Test Results  Results for orders placed or performed during the hospital encounter of 07/08/22 (from the past 24 hour(s))  ROM Plus ([facility] only)     Status: None   Collection Time: 07/08/22  8:05 AM  Result Value Ref Range   Rom Plus POSITIVE   CBC     Status: Abnormal   Collection Time: 07/08/22  9:57 AM  Result Value Ref Range   WBC 8.5 4.0 - 10.5 K/uL   RBC 3.60 (L) 3.87 - 5.11 MIL/uL   Hemoglobin 10.8 (L) 12.0 - 15.0 g/dL   HCT 32.3 (L) 36.0 - 46.0 %   MCV 89.7 80.0 - 100.0 fL   MCH 30.0 26.0 - 34.0 pg   MCHC 33.4 30.0 - 36.0 g/dL   RDW 13.4 11.5 - 15.5 %   Platelets 242 150 - 400 K/uL   nRBC 0.0 0.0 - 0.2 %   Other: GBS pending  Assessment   [id number] at [redacted]w[redacted]d Estimated Date of Delivery: 08/19/22  Reassuring maternal/fetal status.  Patient Active Problem List   Diagnosis Date Noted   Labor and delivery, indication for care 07/08/2022   Supervision of other normal pregnancy, antepartum 01/01/2022   History of 2 cesarean sections 07/23/2019   PROM (premature rupture of membranes) 07/22/2019   History of herpes genitalis 07/05/2019   Asthma 03/26/2019   Former smoker 03/26/2019   Depression 01/05/2019    Plan  1. Admit to L&D  2. GBS swab and admission labs. Initial GBS prophylaxis if positive. 3.  EFM: Category 1. NST q shift. 4. Consult with neonatology  5. Discussed plan with Dr. Amalia Hailey. Will give betamethasone for lung maturity now and induce labor in the AM if no spontaneous labor tonight. Reviewed r/b of RCS vs TOLAC. 6. Pitocin IOL in AM after steroids if no s/s of infection or labor. 7. Valacyclovir for HSV suppression.  Lloyd Huger, CNM 07/08/2022 10:31 AM

## 2022-07-08 NOTE — Progress Notes (Signed)
Confirmed cephalic with BSUS.  Lurlean Horns, CNM

## 2022-07-08 NOTE — Consult Note (Addendum)
[place]  Prenatal Consult       07/08/2022  1:06 PM   I was asked by Dr. Amalia Hailey to consult on this patient for anticipated preterm delivery. I had the pleasure of meeting with Brandi Solomon today. She is a 33 year old G5P2 woman currently at [redacted]w[redacted]d. Pregnancy uncomplicated until she had PROM today. No signs of active labor at this time. History of HSV, no signs or symptoms currently per CNM exam. History of Cesarean X2 and she desires TOLAC. Plan is for IOL if she does not progress to spontaneous labor. She has received BMZ X1. GBS collected and pending. Brandi Solomon and her partner are expecting a baby boy, to be named Phylliss Blakes ("DJ"). She has two daughters at home, ages 75 yrs and 2 yrs, both born at term.  I explained that the neonatal intensive care team would be present for the delivery and outlined the likely delivery room course for this baby including routine resuscitation and NRP-guided approaches to the treatment of respiratory distress. We discussed other common problems associated with prematurity including respiratory distress syndrome/CLD, apnea, feeding issues, and temperature regulation.  We discussed the average length of stay but I noted that the actual LOS would depend on the severity of problems encountered and response to treatments. We discussed visitation policies and the resources available while her child is in the hospital.  Brandi Solomon attempted to breastfeed her daughter but had issues with milk supply (pumped for a few days but didn't get anything). She would like to try breast feeding and pumping with DJ. We discussed the importance of good nutrition and various methods of providing nutrition (parenteral hyperalimentation, gavage feedings and/or oral feeding). We discussed the benefits of human milk. I encouraged breast feeding and pumping soon after birth and outlined resources that are available to support breast feeding. We discussed the possibility of using donor  breast milk as a bridge, to which she is amenable.  Thank you for involving [place] in the care of this patient. A member of our team will be available should the family have additional questions. As her partner was not present, we are happy to answer any questions he may have.   Time for consultation: approximately 20 minutes of face-to-face time in discussion of the risks and medical care associated with preterm delivery.  Renato Shin, MD Neonatal Medicine

## 2022-07-08 NOTE — Progress Notes (Signed)
Progress Note  Brandi Solomon is feeling well with no s/s of labor or infection. She has received 2 doses of betamethasone. Will sleep tonight with Ambien and proceed with IOL in the AM.  NST Baseline FHR: 135 Variability: Moderate Accelerations: present, 15x15 Decelerations: none Toco: no contractions   Lurlean Horns, CNM 07/08/22 11:32 PM

## 2022-07-09 ENCOUNTER — Encounter: Payer: Self-pay | Admitting: Obstetrics and Gynecology

## 2022-07-09 DIAGNOSIS — Z3A34 34 weeks gestation of pregnancy: Secondary | ICD-10-CM

## 2022-07-09 DIAGNOSIS — O42113 Preterm premature rupture of membranes, onset of labor more than 24 hours following rupture, third trimester: Secondary | ICD-10-CM

## 2022-07-09 DIAGNOSIS — O34219 Maternal care for unspecified type scar from previous cesarean delivery: Secondary | ICD-10-CM

## 2022-07-09 LAB — RPR: RPR Ser Ql: NONREACTIVE

## 2022-07-09 MED ORDER — LACTATED RINGERS IV SOLN: INTRAVENOUS | Status: DC

## 2022-07-09 MED ORDER — SODIUM CHLORIDE 0.9 % IV SOLN
2.0000 g | Freq: Four times a day (QID) | INTRAVENOUS | Status: DC
  Administered 2022-07-09 – 2022-07-10 (×5): 2 g via INTRAVENOUS
  Filled 2022-07-09 (×6): qty 2000

## 2022-07-09 MED ORDER — OXYTOCIN-SODIUM CHLORIDE 30-0.9 UT/500ML-% IV SOLN: 1.0000 m[IU]/min | INTRAVENOUS | Status: DC

## 2022-07-09 MED ORDER — TERBUTALINE SULFATE 1 MG/ML IJ SOLN: 0.2500 mg | Freq: Once | INTRAMUSCULAR | Status: DC | PRN

## 2022-07-09 NOTE — Progress Notes (Signed)
   Subjective:  Pt feeling some cramping.  FB placement previously reviewed by RN. Offered FB placement.  Pt agreeable to plan.   Objective:   Vitals: Blood pressure 133/72, pulse 75, temperature 98.1 F (36.7 C), temperature source Axillary, resp. rate 18, height 5\' 11"  (1.803 m), weight 112 kg, last menstrual period 10/21/2021. General: NAD Abdomen:Non tender Cervical Exam:  Dilation: 1 Effacement (%): 50 Cervical Position: Middle Station: -2 Presentation: Vertex Exam by:: Vivica Dobosz, CNM  FHT: baseline 145, moderate variability, pos accel, neg decel Toco:irregular  Pitocin at 8 milli units   No results found for this or any previous visit (from the past 24 hour(s)).  Assessment:   32 y.o. [id number] [redacted]w[redacted]d admitted for PPROM   Plan:   1) Labor -FB placed, will keep Pitocin at 8 milli-units until FB out  Ampicillin given for prophylaxis.   2) Fetus - category I tracing   3) GSB negative, SROM on 2/1 at 0030   4) Pain management: aware of all options, will ask if desired.    Dr Marcelline Mates updated on pt's status, agrees with plan.   Roberto Scales, [facility]  07/09/2022 9:13 PM '

## 2022-07-09 NOTE — Progress Notes (Signed)
   Subjective:  Starting to feel some cramping.   Objective:   Vitals: Blood pressure 116/71, pulse 88, temperature 98.2 F (36.8 C), temperature source Oral, resp. rate 18, height 5\' 11"  (1.803 m), weight 112 kg, last menstrual period 10/21/2021. General: NED Abdomen: Cervical Exam:  Dilation: Fingertip Effacement (%): Thick Cervical Position: Posterior Station: Ballotable Presentation: Vertex Exam by:: Norberto Sorenson, CNM  FHT: baseline 130, moderate variability, pos accel, neg decel  Toco:irregular Pitocin at 7 milli-units   No results found for this or any previous visit (from the past 24 hour(s)).  Assessment:   32 y.o. [id number] [redacted]w[redacted]d admitted for PPROM   Plan:   1) Labor -Pitocin infusing, will defer VE for now.   2) Fetus - Category I tracing  3) GSB negative, SROM on 2/1 at 0030  4) Pain management: aware of all options, will ask if desired.   Roberto Scales, [facility]  07/09/2022 11:08 AM

## 2022-07-09 NOTE — Progress Notes (Signed)
LABOR NOTE   SUBJECTIVE:   Brandi Solomon is a 32 y.o.  [id number]  at [redacted]w[redacted]d with PPROM. She rested through the night and received 2 doses of betamethasone. She has had no s/s of labor and infection. We have reviewed the risks of TOLAC vs RCS, and she has consented to [facility]. We discussed the plan of low-dose Pitocin and indications for RCS.  Analgesia: Labor support without medications and plans epidural  OBJECTIVE:  BP 106/79 (BP Location: Right Arm)   Pulse 93   Temp 97.8 F (36.6 C) (Oral)   Resp 18   Ht 5\' 11"  (1.803 m)   Wt 112 kg   LMP 10/21/2021 (Approximate)   BMI 34.45 kg/m  No intake/output data recorded.  SVE:   Dilation: Fingertip Effacement (%): Thick Station: Ballotable Exam by:: Norberto Sorenson, CNM  CONTRACTIONS: none FHR: Fetal heart tracing reviewed. Baseline: 130 Variability: moderate Accelerations: present Decelerations:none Category 1  Labs: Lab Results  Component Value Date   WBC 8.5 07/08/2022   HGB 10.8 (L) 07/08/2022   HCT 32.3 (L) 07/08/2022   MCV 89.7 07/08/2022   PLT 242 07/08/2022    ASSESSMENT: 1)  PPROM. Starting induction.     Coping: well     Membranes: ruptured, clear fluid  Principal Problem:   Labor and delivery, indication for care   PLAN: Titrate Pitocin to adequate contractions Anticipate NSVD Dr. Amalia Hailey updated Anesthesia aware  Lloyd Huger, CNM 07/09/2022 6:57 AM

## 2022-07-09 NOTE — Progress Notes (Signed)
   Subjective:  Feeling some cramping.   Objective:   Vitals: Blood pressure 130/69, pulse 78, temperature 98.3 F (36.8 C), resp. rate 16, height 5\' 11"  (1.803 m), weight 112 kg, last menstrual period 10/21/2021. General: NAD Abdomen: Cervical Exam:  Dilation: Fingertip Effacement (%): Thick Cervical Position: Middle Station: -2 Presentation: Vertex Exam by:: L Dominc, CNM  FHT: baseline 150, moderate variability, pos accel, neg decel  Toco:irregular  Pitocin at 12 milli units  No results found for this or any previous visit (from the past 24 hour(s)).  Assessment:   32 y.o. [id number] [redacted]w[redacted]d admitted for PPROM  Plan:   1) Labor -continue with Pitocin. Reviewed with pt  induction process, we expect the early phase to take a long time.   2) Fetus - category I tracing  3) GSB negative, SROM on 2/1 at 0030   4) Pain management: aware of all options, will ask if desired.     Roberto Scales, [facility]  2/22024 2:24 PM

## 2022-07-10 ENCOUNTER — Inpatient Hospital Stay: Payer: Medicaid Other | Admitting: Anesthesiology

## 2022-07-10 ENCOUNTER — Encounter: Payer: Self-pay | Admitting: Obstetrics and Gynecology

## 2022-07-10 DIAGNOSIS — Z3A34 34 weeks gestation of pregnancy: Secondary | ICD-10-CM

## 2022-07-10 DIAGNOSIS — O42113 Preterm premature rupture of membranes, onset of labor more than 24 hours following rupture, third trimester: Secondary | ICD-10-CM

## 2022-07-10 DIAGNOSIS — O34219 Maternal care for unspecified type scar from previous cesarean delivery: Secondary | ICD-10-CM

## 2022-07-10 MED ORDER — LIDOCAINE HCL (PF) 1 % IJ SOLN
INTRAMUSCULAR | Status: DC | PRN
  Administered 2022-07-10: 3 mL via SUBCUTANEOUS

## 2022-07-10 MED ORDER — SODIUM CHLORIDE 0.9 % IV SOLN
INTRAVENOUS | Status: DC | PRN
  Administered 2022-07-10 (×2): 5 mL via EPIDURAL

## 2022-07-10 MED ORDER — OXYTOCIN-SODIUM CHLORIDE 30-0.9 UT/500ML-% IV SOLN
1.0000 m[IU]/min | INTRAVENOUS | Status: DC
  Administered 2022-07-10: 20 m[IU]/min via INTRAVENOUS
  Administered 2022-07-10: 2 m[IU]/min via INTRAVENOUS
  Filled 2022-07-10: qty 500

## 2022-07-10 MED ORDER — PHENYLEPHRINE 80 MCG/ML (10ML) SYRINGE FOR IV PUSH (FOR BLOOD PRESSURE SUPPORT): 80.0000 ug | PREFILLED_SYRINGE | INTRAVENOUS | Status: DC | PRN

## 2022-07-10 MED ORDER — FENTANYL-BUPIVACAINE-NACL 0.5-0.125-0.9 MG/250ML-% EP SOLN
EPIDURAL | Status: AC
  Filled 2022-07-10: qty 250

## 2022-07-10 MED ORDER — EPHEDRINE 5 MG/ML INJ: 10.0000 mg | INTRAVENOUS | Status: DC | PRN

## 2022-07-10 MED ORDER — FENTANYL-BUPIVACAINE-NACL 0.5-0.125-0.9 MG/250ML-% EP SOLN
12.0000 mL/h | EPIDURAL | Status: DC | PRN
  Administered 2022-07-10: 12 mL/h via EPIDURAL

## 2022-07-10 MED ORDER — DIPHENHYDRAMINE HCL 50 MG/ML IJ SOLN: 12.5000 mg | INTRAMUSCULAR | Status: DC | PRN

## 2022-07-10 MED ORDER — LACTATED RINGERS IV SOLN
500.0000 mL | Freq: Once | INTRAVENOUS | Status: AC
  Administered 2022-07-10: 500 mL via INTRAVENOUS

## 2022-07-10 MED ORDER — LIDOCAINE-EPINEPHRINE (PF) 1.5 %-1:200000 IJ SOLN
INTRAMUSCULAR | Status: DC | PRN
  Administered 2022-07-10: 3 mL via EPIDURAL

## 2022-07-10 NOTE — Progress Notes (Signed)
  Labor Progress Note   32 y.o. [id number] @ [redacted]w[redacted]d , admitted for  Pregnancy, Labor Management. PPROM  Subjective:  Coping well currently with regular contractions. She would like to proceed with c/s if there is no cervical change. She is ok with continuing labor since she has changed.  Objective:  BP 107/60 (BP Location: Left Arm)   Pulse 66   Temp 98.4 F (36.9 C)   Resp 18   Ht 5\' 11"  (1.803 m)   Wt 112 kg   LMP 10/21/2021 (Approximate)   BMI 34.45 kg/m  Abd: gravid, ND, FHT present, mild tenderness on exam Extr: no edema SVE: CERVIX: 3.5 cm dilated, 60 effaced, -2 station  EFM: FHR: 140 bpm, variability: moderate,  accelerations:  Present,  decelerations:  Absent Toco: Frequency: Every 3-5 minutes Labs: I have reviewed the patient's lab results.   Assessment & Plan:  [id number] @ [redacted]w[redacted]d, admitted for  Pregnancy and Labor/Delivery Management  1. Pain management:  position changes . 2. FWB: FHT category I.  3. ID: GBS negative 4. Labor management: continue pitocin titration 5. Dr Marcelline Mates aware of cervical change and is ok with continuing labor  All discussed with patient, see orders   Rod Can, [facility] 07/10/2022  9:59 AM

## 2022-07-10 NOTE — Progress Notes (Signed)
  Labor Progress Note   32 y.o. [id number] @ [redacted]w[redacted]d , admitted for  Pregnancy, Labor Management. PPROM  Subjective:  Requesting epidural  Objective:  BP 128/82   Pulse 76   Temp 97.9 F (36.6 C)   Resp 18   Ht 5\' 11"  (1.803 m)   Wt 112 kg   LMP 10/21/2021 (Approximate)   BMI 34.45 kg/m  Abd: gravid, ND, FHT present, mild tenderness on exam Extr: no edema SVE: CERVIX: 5 cm dilated, 70 effaced, -2 station  EFM: FHR: 135 bpm, variability: moderate,  accelerations:  Present,  decelerations:  Absent Toco: Frequency: Every 2-3 minutes Labs: I have reviewed the patient's lab results.   Assessment & Plan:  Brandi Solomon @ [redacted]w[redacted]d, admitted for  Pregnancy and Labor/Delivery Management  1. Pain management:  IV analgesia, will call anesthesia to place epidural . 2. FWB: FHT category I.  3. ID: GBS negative 4. Labor management: continue pitocin titration  All discussed with patient, see orders   Rod Can, [facility] 07/10/2022  1:58 PM

## 2022-07-10 NOTE — Progress Notes (Addendum)
   Subjective:  Pt resting, slept only a few hours. Starting to feel contractions that feel stronger than cramps.   Objective:   Vitals: Blood pressure 107/60, pulse 66, temperature 98.4 F (36.9 C), temperature source Oral, resp. rate 18, height 5\' 11"  (1.803 m), weight 112 kg, last menstrual period 10/21/2021. General: NAD Abdomen:non tender  Cervical Exam:  Dilation: 1 Effacement (%): 50 Cervical Position: Middle Station: -2 Presentation: Vertex Exam by:: Zaila Crew, CNM  FHT: baseline 135, moderate variability, pos accel, neg decel  Toco:q 3-5 Pitocin at 8 milli-units  No results found for this or any previous visit (from the past 24 hour(s)).  Assessment:   32 y.o. [id number] [redacted]w[redacted]d admitted for PPROM  Plan:   1) Labor -FB out, minimal cervical change. Reviewed with pt her options, move forward with IOL and start to titrate Pitocin or proceed with a RLTCS. Pt will discuss with her partner.    2) Fetus - category I tracing   3) GSB negative, SROM on 2/1 at 0030   4) Pain management: aware of all options, will ask if desired  Dr Marcelline Mates updated on pt's status and plan   [place], [place]  07/10/2022 7:30 AM

## 2022-07-10 NOTE — Anesthesia Procedure Notes (Signed)
Epidural Patient location during procedure: OB Start time: 07/10/2022 3:00 PM End time: 07/10/2022 3:07 PM  Staffing Anesthesiologist: Martha Clan, MD Performed: anesthesiologist   Preanesthetic Checklist Completed: patient identified, IV checked, site marked, risks and benefits discussed, surgical consent, monitors and equipment checked, pre-op evaluation and timeout performed  Epidural Patient position: sitting Prep: ChloraPrep Patient monitoring: heart rate, continuous pulse ox and blood pressure Approach: midline Location: L3-L4 Injection technique: LOR saline  Needle:  Needle type: Tuohy  Needle gauge: 17 G Needle length: 9 cm Needle insertion depth: 7 cm Catheter type: closed end flexible Catheter size: 19 Gauge Catheter at skin depth: 12 cm Test dose: negative and 1.5% lidocaine with Epi 1:200 K  Assessment Sensory level: T10 Events: blood not aspirated, no cerebrospinal fluid, injection not painful, no injection resistance, no paresthesia and negative IV test  Additional Notes 1st attempt Pt. Evaluated and documentation done after procedure finished. Patient identified. Risks/Benefits/Options discussed with patient including but not limited to bleeding, infection, nerve damage, paralysis, failed block, incomplete pain control, headache, blood pressure changes, nausea, vomiting, reactions to medication both or allergic, itching and postpartum back pain. Confirmed with bedside nurse the patient's most recent platelet count. Confirmed with patient that they are not currently taking any anticoagulation, have any bleeding history or any family history of bleeding disorders. Patient expressed understanding and wished to proceed. All questions were answered. Sterile technique was used throughout the entire procedure. Please see nursing notes for vital signs. Test dose was given through epidural catheter and negative prior to continuing to dose epidural or start infusion. Warning  signs of high block given to the patient including shortness of breath, tingling/numbness in hands, complete motor block, or any concerning symptoms with instructions to call for help. Patient was given instructions on fall risk and not to get out of bed. All questions and concerns addressed with instructions to call with any issues or inadequate analgesia.    Patient tolerated the insertion well without immediate complications.Reason for block:procedure for pain

## 2022-07-10 NOTE — Anesthesia Preprocedure Evaluation (Signed)
Anesthesia Evaluation  Patient identified by MRN, date of birth, ID band Patient awake    Reviewed: Allergy & Precautions, NPO status , Patient's Chart, lab work & pertinent test results  History of Anesthesia Complications Negative for: history of anesthetic complications  Airway Mallampati: III  TM Distance: >3 FB Neck ROM: Full    Dental no notable dental hx. (+) Teeth Intact   Pulmonary neg shortness of breath, asthma , neg sleep apnea, neg COPD, neg recent URI, Patient abstained from smoking.Not current smoker, former smoker   Pulmonary exam normal breath sounds clear to auscultation       Cardiovascular Exercise Tolerance: Good METS(-) hypertension(-) angina (-) CAD and (-) Past MI negative cardio ROS (-) dysrhythmias  Rhythm:Regular Rate:Normal - Systolic murmurs    Neuro/Psych  PSYCHIATRIC DISORDERS  Depression    negative neurological ROS     GI/Hepatic ,neg GERD  ,,(+)     (-) substance abuse    Endo/Other  neg diabetes    Renal/GU negative Renal ROS     Musculoskeletal   Abdominal   Peds  Hematology   Anesthesia Other Findings Past Medical History: No date: Asthma No date: Depression affecting pregnancy No date: Obesity (BMI 30-39.9)  Reproductive/Obstetrics (+) Pregnancy                             Anesthesia Physical Anesthesia Plan  ASA: 2  Anesthesia Plan: Epidural   Post-op Pain Management:    Induction:   PONV Risk Score and Plan:   Airway Management Planned:   Additional Equipment:   Intra-op Plan:   Post-operative Plan:   Informed Consent: I have reviewed the patients History and Physical, chart, labs and discussed the procedure including the risks, benefits and alternatives for the proposed anesthesia with the patient or authorized representative who has indicated his/her understanding and acceptance.       Plan Discussed with:  Surgeon  Anesthesia Plan Comments: (Discussed R/B/A of neuraxial anesthesia technique with patient: - rare risks of spinal/epidural hematoma, nerve damage, infection - Risk of PDPH - Risk of itching - Risk of nausea and vomiting - Risk of poor block necessitating replacement of epidural. Patient voiced understanding.  Patient is a TOLAC. Preop entered into computer after procedure.)        Anesthesia Quick Evaluation

## 2022-07-10 NOTE — Progress Notes (Signed)
Pitocin stopped at 4:30 PM due to fetal heart rate variables and mild late decelerations. Cervical exam at 5:35 PM per RN 7/80/0. Category I reactive NST and restart of pitocin at 6:30 PM to bring contractions closer together. Patient is doing well and comfortable with epidural.   Will recheck cervix by 9:00 PM or sooner as needed.   Christean Leaf, CNM [facility] 07/10/2022, 8:11 PM

## 2022-07-10 NOTE — Progress Notes (Signed)
   Subjective:  Pt resting quietly per RN   Objective:   Vitals: Blood pressure (!) 114/52, pulse 64, temperature 98.4 F (36.9 C), temperature source Axillary, resp. rate 18, height 5\' 11"  (1.803 m), weight 112 kg, last menstrual period 10/21/2021. General:  Abdomen: Cervical Exam:  Dilation: 1 Effacement (%): 50 Cervical Position: Middle Station: -2 Presentation: Vertex Exam by:: Deyona Soza, CNM  FHT: baseline 140, moderate variability, pos accel, neg decel  Toco:q 3-5  Pitocin @ 8 milli-units  No results found for this or any previous visit (from the past 24 hour(s)).  Assessment:   32 y.o. [id number] [redacted]w[redacted]d admitted for PPROM  Plan:   1) Labor -FB remains in place, will keep Pitocin at 8 milli-units until FB out   2) Fetus - category I tracing   3) GSB negative, SROM on 2/1 at 0030   4) Pain management: aware of all options, will ask if desired.    Roberto Scales, [facility]  07/10/2022 2:55 AM

## 2022-07-10 NOTE — Progress Notes (Signed)
  Labor Progress Note   32 y.o. [id number] @ [redacted]w[redacted]d , admitted for  Pregnancy, Labor Management. PPROM  Subjective:  Comfortable with epidural  Objective:  BP 122/74 (BP Location: Right Arm)   Pulse 68   Temp 98.3 F (36.8 C) (Oral)   Resp 16   Ht 5\' 11"  (1.803 m)   Wt 112 kg   LMP 10/21/2021 (Approximate)   SpO2 98%   BMI 34.45 kg/m  Abd: gravid, ND, FHT present, mild tenderness on exam Extr: no edema SVE: CERVIX: anterior/right lip cm dilated, 90 effaced, 0 to +1 station  Pitocin currently at 4 mu/min  EFM: FHR: 140 bpm, variability: moderate,  accelerations:  Present,  decelerations:  Present variables Toco: Frequency: Every 3-7 minutes Labs: I have reviewed the patient's lab results.   Assessment & Plan:  [id number] @ [redacted]w[redacted]d, admitted for  Pregnancy and Labor/Delivery Management  1. Pain management: epidural. 2. FWB: FHT category I/II.  3. ID: GBS negative 4. Labor management: continue pitocin titration   All discussed with patient, see orders   Rod Can, [facility] 07/10/2022  9:21 PM

## 2022-07-11 ENCOUNTER — Encounter: Payer: Self-pay | Admitting: Obstetrics and Gynecology

## 2022-07-11 DIAGNOSIS — B009 Herpesviral infection, unspecified: Secondary | ICD-10-CM

## 2022-07-11 DIAGNOSIS — O34219 Maternal care for unspecified type scar from previous cesarean delivery: Secondary | ICD-10-CM

## 2022-07-11 DIAGNOSIS — O42113 Preterm premature rupture of membranes, onset of labor more than 24 hours following rupture, third trimester: Secondary | ICD-10-CM

## 2022-07-11 DIAGNOSIS — O9882 Other maternal infectious and parasitic diseases complicating childbirth: Secondary | ICD-10-CM

## 2022-07-11 DIAGNOSIS — Z3A34 34 weeks gestation of pregnancy: Secondary | ICD-10-CM

## 2022-07-11 LAB — CBC
HCT: 31 % — ABNORMAL LOW (ref 36.0–46.0)
Hemoglobin: 10.3 g/dL — ABNORMAL LOW (ref 12.0–15.0)
MCH: 30.2 pg (ref 26.0–34.0)
MCHC: 33.2 g/dL (ref 30.0–36.0)
MCV: 90.9 fL (ref 80.0–100.0)
Platelets: 203 10*3/uL (ref 150–400)
RBC: 3.41 MIL/uL — ABNORMAL LOW (ref 3.87–5.11)
RDW: 13.5 % (ref 11.5–15.5)
WBC: 10.3 10*3/uL (ref 4.0–10.5)
nRBC: 0 % (ref 0.0–0.2)

## 2022-07-11 MED ORDER — SENNOSIDES-DOCUSATE SODIUM 8.6-50 MG PO TABS: 2.0000 | ORAL_TABLET | Freq: Every day | ORAL | Status: DC

## 2022-07-11 MED ORDER — IBUPROFEN 600 MG PO TABS
600.0000 mg | ORAL_TABLET | Freq: Four times a day (QID) | ORAL | Status: DC
  Administered 2022-07-11 – 2022-07-13 (×8): 600 mg via ORAL
  Filled 2022-07-11 (×8): qty 1

## 2022-07-11 MED ORDER — COCONUT OIL OIL: 1.0000 | TOPICAL_OIL | Status: DC | PRN

## 2022-07-11 MED ORDER — IBUPROFEN 600 MG PO TABS
600.0000 mg | ORAL_TABLET | Freq: Four times a day (QID) | ORAL | Status: DC
  Administered 2022-07-11: 600 mg via ORAL
  Filled 2022-07-11: qty 1

## 2022-07-11 MED ORDER — SIMETHICONE 80 MG PO CHEW: 80.0000 mg | CHEWABLE_TABLET | ORAL | Status: DC | PRN

## 2022-07-11 MED ORDER — WITCH HAZEL-GLYCERIN EX PADS
1.0000 | MEDICATED_PAD | CUTANEOUS | Status: DC | PRN
  Administered 2022-07-11: 1 via TOPICAL
  Filled 2022-07-11: qty 100

## 2022-07-11 MED ORDER — DIPHENHYDRAMINE HCL 25 MG PO CAPS: 25.0000 mg | ORAL_CAPSULE | Freq: Four times a day (QID) | ORAL | Status: DC | PRN

## 2022-07-11 MED ORDER — ONDANSETRON HCL 4 MG PO TABS: 4.0000 mg | ORAL_TABLET | ORAL | Status: DC | PRN

## 2022-07-11 MED ORDER — BENZOCAINE-MENTHOL 20-0.5 % EX AERO
1.0000 | INHALATION_SPRAY | CUTANEOUS | Status: DC | PRN
  Administered 2022-07-11: 1 via TOPICAL
  Filled 2022-07-11: qty 56

## 2022-07-11 MED ORDER — PRENATAL MULTIVITAMIN CH
1.0000 | ORAL_TABLET | Freq: Every day | ORAL | Status: DC
  Administered 2022-07-11 – 2022-07-12 (×2): 1 via ORAL
  Filled 2022-07-11 (×2): qty 1

## 2022-07-11 MED ORDER — DIBUCAINE (PERIANAL) 1 % EX OINT
1.0000 | TOPICAL_OINTMENT | CUTANEOUS | Status: DC | PRN
  Administered 2022-07-11: 1 via RECTAL
  Filled 2022-07-11: qty 28

## 2022-07-11 MED ORDER — ONDANSETRON HCL 4 MG/2ML IJ SOLN: 4.0000 mg | INTRAMUSCULAR | Status: DC | PRN

## 2022-07-11 MED ORDER — ACETAMINOPHEN 325 MG PO TABS
650.0000 mg | ORAL_TABLET | ORAL | Status: DC | PRN
  Administered 2022-07-11: 650 mg via ORAL
  Filled 2022-07-11: qty 2

## 2022-07-11 NOTE — Discharge Summary (Addendum)
OB Discharge Summary     Patient Name: Brandi Solomon DOB: 11/04/1990 MRN: [id number]  Date of admission: 07/08/2022 Delivering MD: Rod Can, CNM.  Date of Delivery: 07/11/2022 Date of discharge:07/13/2022  Admitting diagnosis: Labor and delivery, indication for care [O75.9] Intrauterine pregnancy: [redacted]w[redacted]d    Secondary diagnosis:  PPROM     Discharge diagnosis: Preterm Pregnancy Delivered, VBAC                                                                                              Post partum procedures: none  Augmentation: Pitocin and IP Foley  Complications: None  Hospital course:  Induction of Labor With Vaginal Delivery   32y.o. yo [id number] admitted to the hospital 07/08/2022 for induction of labor.  Indication for induction:  PPROM .  Patient had a labor course complicated by prolonged first stage. Membrane Rupture Time/Date: 12:30 AM ,07/08/2022   Delivery Method:Vaginal, Spontaneous  Episiotomy: none Lacerations: none Details of delivery can be found in separate delivery note.    Patient had a postpartum course uncomplicated. She is tolerating a regular diet, her pain is controlled with PO medications, she is ambulating and voiding without difficulty.  Patient is discharged home 07/13/22.  Newborn Data: Birth date:07/11/2022  Birth time:12:43 AM  Gender:Female   DaPhylliss Blakesiving status:Living  Apgars:8 ,8  Weight:2200 g  4 pounds 14 ounces  Physical exam  Vitals:   07/12/22 1528 07/12/22 2330 07/13/22 0342 07/13/22 0745  BP: 124/78 (!) 144/83 115/78 (!) 116/90  Pulse: 60 60 60 60  Resp: 20 19 18 18  $ Temp: 98.6 F (37 C) 98.3 F (36.8 C)  98.3 F (36.8 C)  TempSrc: Oral Oral  Oral  SpO2: 100% 100% 100% 100%  Weight:      Height:       General: alert, cooperative, and no distress Lochia: appropriate Uterine Fundus: firm Incision: N/A DVT Evaluation: No evidence of DVT seen on physical exam. No cords or calf tenderness. No significant  calf/ankle edema.  Labs: Lab Results  Component Value Date   WBC 10.3 07/11/2022   HGB 10.3 (L) 07/11/2022   HCT 31.0 (L) 07/11/2022   MCV 90.9 07/11/2022   PLT 203 07/11/2022    Discharge instruction: per After Visit Summary.  Medications:  Allergies as of 07/13/2022   No Known Allergies      Medication List     TAKE these medications    albuterol 108 (90 Base) MCG/ACT inhaler Commonly known as: VENTOLIN HFA Inhale 1-2 puffs into the lungs every 6 (six) hours as needed for wheezing or shortness of breath.   Fusion Plus Caps Take 1 tablet by mouth daily.   multivitamin-prenatal 27-0.8 MG Tabs tablet Take 2 tablets by mouth daily. Gummie        Diet: routine diet  Activity: Advance as tolerated. Pelvic rest for 6 weeks.   Outpatient follow up:  Follow-up Information     GlRod CanCNM. Schedule an appointment as soon as possible for a visit.   Specialty: Obstetrics Why: 2 week telephone visit and 6 week in office  postpartum visits Contact information: [place] [address] [phone]                   Postpartum contraception: IUD ,to be placed in office  Rhogam Given postpartum: Rh positive Rubella vaccine given postpartum: immune Varicella vaccine given postpartum: immune TDaP given antepartum or postpartum: given antepartum    Newborn Delivery   Birth date/time: 07/11/2022 00:43:00 Delivery type: Vaginal, Spontaneous       Baby Feeding:  pumping  Disposition:NICU  SIGNED:  Philip Aspen, CNM 07/13/2022 9:32 AM

## 2022-07-11 NOTE — Lactation Note (Signed)
This note was copied from a baby's chart. Lactation Consultation Note  Patient Name: Boy Kayann Maj EFEOF'H Date: 07/11/2022 Reason for consult: NICU baby;Late-preterm 34-36.6wks;Follow-up assessment (Pump set up and education) Age:32 hours  Maternal Data  G5P3, Mother delivered via SVD with membrane rupture. Previous 2 births were csections. Mom was alert and in positive spirit. Explained was unable to breastfeed as she'd wanted with 32 year old.  Feeding Mother interested in pumping while son, Phylliss Blakes "Radonna Ricker" is in Special Care Nursery. Shown how to set up and clean DEBP and provided education. Pumped for one cycle and will provide milk to the Nurse for delivery to Nursery. Mother's Current Feeding Choice: Breast Milk  LATCH Score  N/A - no latch was observed as baby is in Special Care Nursery  Lactation Tools Discussed/Used DEBP. Reviewed set-up, cleaning, and milk storage guidelines  Interventions Mom was shown how to use pump and provided feedback education that she understood how to operate the pump initially.  Interventions: DEBP;Education  Discharge Mother's information was collected and fax will be sent to [facility] for referral. Pump: Refer for rental [facility] Program: Yes  Consult Status Consult Status: Follow-up Date: 07/12/22 Follow-up type: In-patient  Update provided to care nurse  Carola Rhine 07/11/2022, 3:11 PM

## 2022-07-11 NOTE — Progress Notes (Signed)
Post Partum Day 0 Subjective: no complaints, up ad lib, voiding, and tolerating PO  Objective: Blood pressure 101/68, pulse (!) 47, temperature 98.5 F (36.9 C), resp. rate 18, height 5\' 11"  (1.803 m), weight 112 kg, last menstrual period 10/21/2021, SpO2 99 %, unknown if currently breastfeeding.  Physical Exam:  General: alert, cooperative, no distress, and moderately obese Lochia: appropriate Uterine Fundus: firm Incision: intact DVT Evaluation: No evidence of DVT seen on physical exam. Negative Homan's sign.  Recent Labs    07/11/22 0648  HGB 10.3*  HCT 31.0*    Assessment/Plan: Plan for discharge tomorrow, Breastfeeding, and Lactation consult   LOS: 3 days   Imagene Riches, CNM 07/11/2022, 10:28 AM

## 2022-07-12 NOTE — Progress Notes (Signed)
Obstetric Postpartum Daily Progress Note Subjective:  32 y.o. [id number] postpartum day #1 status post VBAC.  She is ambulating, is tolerating po, is voiding spontaneously.  Her pain is well controlled on PO pain medications. Her lochia is less than menses. Her infant is in SCN. She has been pumping, gets about 45ml. Mood is good. Partner is present. They desire to stay one more day to be near their infant.    Medications SCHEDULED MEDICATIONS   ibuprofen  600 mg Oral Q6H   prenatal multivitamin  1 tablet Oral Q1200   senna-docusate  2 tablet Oral Daily    MEDICATION INFUSIONS    PRN MEDICATIONS  acetaminophen, benzocaine-Menthol, coconut oil, witch hazel-glycerin **AND** dibucaine, diphenhydrAMINE, ondansetron **OR** ondansetron (ZOFRAN) IV, simethicone    Objective:   Vitals:   07/11/22 1621 07/11/22 2002 07/11/22 2304 07/12/22 0800  BP: 120/77 133/82 (!) 131/94 134/84  Pulse: 65 68 63 60  Resp: 20 20 20 18   Temp: 98.1 F (36.7 C) 97.7 F (36.5 C) 98.1 F (36.7 C) 97.6 F (36.4 C)  TempSrc: Oral Oral Oral Oral  SpO2: 100% 100% 100% 100%  Weight:      Height:        Current Vital Signs 24h Vital Sign Ranges  T 97.6 F (36.4 C) Temp  Avg: 97.9 F (36.6 C)  Min: 97.6 F (36.4 C)  Max: 98.1 F (36.7 C)  BP 134/84 BP  Min: 112/78  Max: 134/84  HR 60 Pulse  Avg: 65.5  Min: 60  Max: 70  RR 18 Resp  Avg: 19.3  Min: 18  Max: 20  SaO2 100 % Room Air SpO2  Avg: 100 %  Min: 100 %  Max: 100 %       24 Hour I/O Current Shift I/O  Time Ins Outs No intake/output data recorded. No intake/output data recorded.  General: NAD Breasts: soft, no redness or masses, nipples erect and intact bilaterally  Pulmonary: no increased work of breathing Abdomen: non-distended, non-tender, fundus firm at level of umbilicus Perineum: declines exam  Extremities: trace edema, no erythema, no tenderness  Labs:  Recent Labs  Lab 07/08/22 0957 07/11/22 0648  WBC 8.5 10.3  HGB 10.8* 10.3*  HCT  32.3* 31.0*  PLT 242 203     Assessment:   32 y.o. [id number] postpartum day # 1 status post VBAC  Plan:   1) Acute blood loss anemia - hemodynamically stable and asymptomatic - po ferrous sulfate  2) A POS / Rubella 1.47 (08/01 0940)/ Varicella Immune  3) TDAP status 06/03/2022  4) breast, infant in SCN  /Contraception = Depo-Provera  5) Disposition discharge tomorrow   [place], CNM  07/12/2022 9:58 AM

## 2022-07-12 NOTE — Anesthesia Postprocedure Evaluation (Signed)
Anesthesia Post Note  Patient: Tax adviser  Procedure(s) Performed: AN AD HOC LABOR EPIDURAL  Patient location during evaluation: Mother Baby Anesthesia Type: Epidural Level of consciousness: awake and alert Pain management: pain level controlled Vital Signs Assessment: post-procedure vital signs reviewed and stable Respiratory status: spontaneous breathing, nonlabored ventilation and respiratory function stable Cardiovascular status: stable Postop Assessment: no headache, no backache and epidural receding Anesthetic complications: no  No notable events documented.   Last Vitals:  Vitals:   07/11/22 2002 07/11/22 2304  BP: 133/82 (!) 131/94  Pulse: 68 63  Resp: 20 20  Temp: 36.5 C 36.7 C  SpO2: 100% 100%    Last Pain:  Vitals:   07/12/22 0331  TempSrc:   PainSc: [place]

## 2022-07-13 NOTE — Progress Notes (Signed)
Patient discharged. Discharge instructions given. Patient verbalizes understanding. Transported by axillary. 

## 2022-07-13 NOTE — Final Progress Note (Signed)
Final Progress Note  Patient ID: Brandi Solomon MRN: [id number] DOB/AGE: 32-Mar-1992 32 y.o.  Admit date: 07/08/2022 Admitting provider: Harlin Heys, MD Discharge date: 07/13/2022   Admission Diagnoses: Labor and delivery , indication for care  Discharge Diagnoses:  Principal Problem:   Labor and delivery, indication for care Active Problems:   Postpartum care following vaginal delivery   VBAC, delivered, current hospitalization    Consults:  NICU  Significant Findings/ Diagnostic Studies: none  Procedures: Epidural  Discharge Condition: good  Disposition: Discharge disposition: 01-Home or Self Care       Diet: Regular diet  Discharge Activity: No lifting, driving, or strenuous exercise for 2-4 weeks   Allergies as of 07/13/2022   No Known Allergies      Medication List     TAKE these medications    albuterol 108 (90 Base) MCG/ACT inhaler Commonly known as: VENTOLIN HFA Inhale 1-2 puffs into the lungs every 6 (six) hours as needed for wheezing or shortness of breath.   Fusion Plus Caps Take 1 tablet by mouth daily.   multivitamin-prenatal 27-0.8 MG Tabs tablet Take 2 tablets by mouth daily. Gummie        Follow-up Information     Rod Can, CNM. Schedule an appointment as soon as possible for a visit.   Specialty: Obstetrics Why: 2 week telephone visit and 6 week in office postpartum visits Contact information: [place] [place] [address] [phone]                 Total time spent taking care of this patient: 12 minutes  Signed: Philip Aspen 07/13/2022, 9:32 AM

## 2022-07-13 NOTE — Lactation Note (Signed)
This note was copied from a baby's chart. Lactation Consultation Note  Patient Name: Boy Lashay Osborne DSKAJ'G Date: 07/13/2022   Age:32 hours  Lactation check-in. Mom and dad at baby's bedside in SCN. Attempt with lick and learn during 11am feeding. Baby positioned well in cross cradle hold at R breast by SCN RN. Baby appears comfortable and relaxed, suckling occasionally with breaks as needed; maintaining stats.  Mom continues to pump often, but is getting discouraged with small output- "drops". We discussed the ebb and flow of milk expression in the early days of lactogenesis II. Provided education and understanding of milk supply and demand and the normal course of lactation. Encouraged mom to trust the process and continue with pumping frequency aiming for every 3 hours.  Mom plans to contact [facility]) today before leaving in efforts for pump obtainment. Asked her to let us know if there was difficulty and we could work on pump from [facility] if needed.   Lavonia Drafts 07/13/2022, 11:04 AM

## 2022-07-13 NOTE — Lactation Note (Signed)
This note was copied from a baby's chart. Lactation Consultation Note  Patient Name: Brandi Solomon Today's Date: 07/13/2022  Pump [id number] loaned out.  Lavonia Drafts 07/13/2022, 12:28 PM

## 2022-07-15 ENCOUNTER — Encounter: Payer: Medicaid Other | Admitting: Obstetrics and Gynecology

## 2022-07-16 ENCOUNTER — Ambulatory Visit: Payer: Self-pay

## 2022-07-16 ENCOUNTER — Encounter: Payer: Self-pay | Admitting: Advanced Practice Midwife

## 2022-07-16 NOTE — Lactation Note (Signed)
This note was copied from a baby's chart. Lactation Consultation Note  Patient Name: Brandi Solomon Date: 07/16/2022 Reason for consult: Follow-up assessment;Mother's request;RN request;Breastfeeding assistance;Infant < 6lbs;Late-preterm 34-36.6wks Age:32 days  Maternal Data See initial consult. Has patient been taught Hand Expression?: Yes  Requested by care nurse to assist mom with breastfeeding session.  Feeding Mother's Current Feeding Choice: Breast Milk and Donor Milk Assisted mom with maximizing position and latch techniques while baby was in the upright football hold. Over a period of 10 minutes baby had 7 sucking bursts with intermittent rest periods. A few swallows were noted. Baby remained physiologically stable throughout. LATCH Score Latch: Grasps breast easily, tongue down, lips flanged, rhythmical sucking.  Audible Swallowing: Spontaneous and intermittent  Type of Nipple: Everted at rest and after stimulation  Comfort (Breast/Nipple): Soft / non-tender  Hold (Positioning): Assistance needed to correctly position infant at breast and maintain latch.  LATCH Score: 9  Interventions Interventions: Support pillows;Adjust position;Position options;Assisted with latch;Education     Consult Status Consult Status: PRN Date: 07/17/22 Follow-up type: In-patient  Care nurse at bedside .Update provided.  Jonna Chizara Mena 07/16/2022, 6:25 PM

## 2022-07-18 ENCOUNTER — Encounter: Payer: Self-pay | Admitting: Advanced Practice Midwife

## 2022-07-22 ENCOUNTER — Encounter: Payer: Self-pay | Admitting: Advanced Practice Midwife

## 2022-07-23 ENCOUNTER — Encounter: Payer: Self-pay | Admitting: Certified Nurse Midwife

## 2022-07-23 ENCOUNTER — Telehealth (INDEPENDENT_AMBULATORY_CARE_PROVIDER_SITE_OTHER): Payer: Medicaid Other | Admitting: Certified Nurse Midwife

## 2022-07-23 DIAGNOSIS — Z1332 Encounter for screening for maternal depression: Secondary | ICD-10-CM

## 2022-07-23 DIAGNOSIS — Z1331 Encounter for screening for depression: Secondary | ICD-10-CM

## 2022-07-23 NOTE — Progress Notes (Signed)
Virtual Visit via Telephone Note  I connected with Brandi Solomon on 07/23/22 at  3:35 PM EST by telephone and verified that I am speaking with the correct person using two identifiers. We attempt to have a video visit however , due to technical difficulties the visit was turned into telephone call.   Location: Patient: at home Provider: at office    I discussed the limitations, risks, security and privacy concerns of performing an evaluation and management service by telephone and the availability of in person appointments. I also discussed with the patient that there may be a patient responsible charge related to this service. The patient expressed understanding and agreed to proceed.   History of Present Illness:  Status post VBAC on 07/11/22 at 34 wks PPROM    Observations/Objective:  Pt state she is doing really well, her bleeding has declined, and she only has mild cramping with pumping. She states she has support from her family and the FOBs family.   Assessment and Plan:  Edinburgh Postnatal Depression Scale - 07/23/22 1511       Edinburgh Postnatal Depression Scale:  In the Past 7 Days   I have been able to laugh and see the funny side of things. 0    I have looked forward with enjoyment to things. 0    I have blamed myself unnecessarily when things went wrong. 0    I have been anxious or worried for no good reason. 0    I have felt scared or panicky for no good reason. 0    Things have been getting on top of me. 0    I have been so unhappy that I have had difficulty sleeping. 0    I have felt sad or miserable. 0    I have been so unhappy that I have been crying. 0    The thought of harming myself has occurred to me. 0    Edinburgh Postnatal Depression Scale Total 0             Follow Up Instructions: In office 4wks for PPV.    I discussed the assessment and treatment plan with the patient. The patient was provided an opportunity to ask questions and all were  answered. The patient agreed with the plan and demonstrated an understanding of the instructions.   The patient was advised to call back or seek an in-person evaluation if the symptoms worsen or if the condition fails to improve as anticipated.  I provided 7 minutes of non-face-to-face time during this encounter.   Philip Aspen, CNM

## 2022-08-19 ENCOUNTER — Ambulatory Visit (INDEPENDENT_AMBULATORY_CARE_PROVIDER_SITE_OTHER): Payer: Medicaid Other | Admitting: Advanced Practice Midwife

## 2022-08-19 ENCOUNTER — Encounter: Payer: Self-pay | Admitting: Advanced Practice Midwife

## 2022-08-19 DIAGNOSIS — Z30430 Encounter for insertion of intrauterine contraceptive device: Secondary | ICD-10-CM | POA: Diagnosis not present

## 2022-08-19 MED ORDER — LEVONORGESTREL 20 MCG/DAY IU IUD
1.0000 | INTRAUTERINE_SYSTEM | Freq: Once | INTRAUTERINE | Status: AC
  Administered 2022-08-19: 1 via INTRAUTERINE

## 2022-08-19 NOTE — Progress Notes (Signed)
[facility]  Postpartum Visit  Chief Complaint:  Chief Complaint  Patient presents with   Postpartum Care    6 week pp    History of Present Illness: Patient is a 32 y.o. [id number] presents for postpartum visit.  Review the Delivery Report for details.   Date of delivery: 07/11/2022 Type of delivery: Vaginal delivery - Vacuum or forceps assisted  no Episiotomy No.  Laceration: no  Pregnancy or labor problems:  no Any problems since the delivery:  no  Newborn Details:  SINGLETON :  1. BabyGender female. Birth weight: 4 pounds 14 ounces Maternal Details:  Breast or formula feeding: formula feeding Intercourse: No  Contraception after delivery:  Mirena IUD today, see insertion note Any bowel or bladder issues: No  Post partum depression/anxiety noted:  no, she takes naps as needed and has a good support system around her [place] Post-Partum Depression Score: 2 Date of last PAP: 2023  no abnormalities   Review of Systems: Review of Systems  Constitutional:  Negative for chills and fever.  HENT:  Negative for congestion, ear discharge, ear pain, hearing loss, sinus pain and sore throat.   Eyes:  Negative for blurred vision and double vision.  Respiratory:  Negative for cough, shortness of breath and wheezing.   Cardiovascular:  Negative for chest pain, palpitations and leg swelling.  Gastrointestinal:  Negative for abdominal pain, blood in stool, constipation, diarrhea, heartburn, melena, nausea and vomiting.  Genitourinary:  Negative for dysuria, flank pain, frequency, hematuria and urgency.  Musculoskeletal:  Negative for back pain, joint pain and myalgias.  Skin:  Negative for itching and rash.  Neurological:  Negative for dizziness, tingling, tremors, sensory change, speech change, focal weakness, seizures, loss of consciousness, weakness and headaches.  Endo/Heme/Allergies:  Negative for environmental allergies. Does not bruise/bleed easily.  Psychiatric/Behavioral:   Negative for depression, hallucinations, memory loss, substance abuse and suicidal ideas. The patient is not nervous/anxious and does not have insomnia.      Past Medical History:  Past Medical History:  Diagnosis Date   Asthma    Back pain affecting pregnancy in third trimester 06/27/2019   Delivery of pregnancy by cesarean section 07/25/2019   Depression affecting pregnancy    Elevated blood pressure affecting pregnancy, antepartum 01/19/2019   Labile - pt attributes to anxiety  No h/o meds  Offered baby aspirin  Consider baseline labs   Failed trial of labor, delivered, current hospitalization 07/25/2019   Fetal intolerance to labor, delivered, current hospitalization 07/25/2019   Obesity (BMI 30-39.9)    Obesity affecting pregnancy 03/26/2019   Advised to limit weight gain   Postpartum care following cesarean delivery 07/25/2019   Previous cesarean delivery, antepartum condition or complication [id number]   Done for breech Pt desires TOLAC   MFMU calculator 66% success   [email or web address] Predicted chance of vaginal birth after cesarean:  58.3% 95% confidence interval:  [54.0%, 62.5%]   S/P C-section 07/23/2019   Supervision of other normal pregnancy, antepartum 01/05/2019     [facility] Prenatal Labs Dating  12 wk [place] Blood type: A/Positive/-- (08/14 1611)  Genetic Screen NIPS: Negative XX Antibody:Negative (08/14 1611) Anatomic [place] incomplete Rubella: 2.18 (08/14 1611) Varicella: Immune GTT  28 wk:    116  RPR: Non Reactive (08/14 1611)  Rhogam  not needed HBsAg: Negative (08/14 1611)  TDaP vaccine  05/24/2019                     HIV: Non Reactive (08/14  36)  F    Past Surgical History:  Past Surgical History:  Procedure Laterality Date   CESAREAN SECTION     CESAREAN SECTION N/A 07/23/2019   Procedure: CESAREAN SECTION;  Surgeon: Gae Dry, MD;  Location: [facility] ORS;  Service: Obstetrics;  Laterality: N/A;    Family  History:  Family History  Problem Relation Age of Onset   Alzheimer's disease Maternal Grandmother    Throat cancer Maternal Aunt    Skin cancer Maternal Aunt    Hypertension Maternal Aunt    Diabetes Mellitus II Maternal Aunt    Diabetes Half-Sister     Social History:  Social History   Socioeconomic History   Marital status: Single    Spouse name: Quillian Quince   Number of children: 2   Years of education: 12   Highest education level: Not on file  Occupational History   Occupation: works at [employer]: on weekends  Tobacco Use   Smoking status: Former    Types: Cigarettes   Smokeless tobacco: Never   Tobacco comments:    Quit since finding out she was pregnant.   Vaping Use   Vaping Use: Never used  Substance and Sexual Activity   Alcohol use: Not Currently    Comment: Quit since finding out she was pregnant.    Drug use: No   Sexual activity: Not Currently    Birth control/protection: None  Other Topics Concern   Not on file  Social History Narrative   Not on file   Social Determinants of Health   Financial Resource Strain: Low Risk  (01/01/2022)   Overall Financial Resource Strain (CARDIA)    Difficulty of Paying Living Expenses: Not very hard  Food Insecurity: No Food Insecurity (07/08/2022)   Hunger Vital Sign    Worried About Running Out of Food in the Last Year: Never true    Ran Out of Food in the Last Year: Never true  Transportation Needs: No Transportation Needs (07/08/2022)   PRAPARE - Hydrologist (Medical): No    Lack of Transportation (Non-Medical): No  Physical Activity: Sufficiently Active (01/01/2022)   Exercise Vital Sign    Days of Exercise per Week: 4 days    Minutes of Exercise per Session: 90 min  Stress: No Stress Concern Present (01/01/2022)   [place]    Feeling of Stress : Not at all  Social Connections: Moderately Isolated (01/01/2022)    Social Connection and Isolation Panel [NHANES]    Frequency of Communication with Friends and Family: More than three times a week    Frequency of Social Gatherings with Friends and Family: Twice a week    Attends Religious Services: Never    Marine scientist or Organizations: No    Attends Archivist Meetings: Never    Marital Status: Living with partner  Intimate Partner Violence: Not At Risk (01/01/2022)   Humiliation, Afraid, Rape, and Kick questionnaire    Fear of Current or Ex-Partner: No    Emotionally Abused: No    Physically Abused: No    Sexually Abused: No    Allergies:  No Known Allergies  Medications: Prior to Admission medications   Medication Sig Start Date End Date Taking? Authorizing Provider  albuterol (VENTOLIN HFA) 108 (90 Base) MCG/ACT inhaler Inhale 1-2 puffs into the lungs every 6 (six) hours as needed for wheezing or shortness of breath. 04/12/22  Yes  Jaynee Eagles, PA-C  Iron-FA-B Cmp-C-Biot-Probiotic (FUSION PLUS) CAPS Take 1 tablet by mouth daily. Patient not taking: Reported on 07/23/2022 06/08/22   Philip Aspen, CNM  Prenatal Vit-Fe Fumarate-FA (MULTIVITAMIN-PRENATAL) 27-0.8 MG TABS tablet Take 2 tablets by mouth daily. Gummie Patient not taking: Reported on 07/23/2022    [provider]    Physical Exam Blood pressure (!) 156/95, weight 228 lb 1.6 oz (103.5 kg), not currently breastfeeding.    General: NAD HEENT: normocephalic, anicteric Pulmonary: No increased work of breathing Abdomen: NABS, soft, non-tender, non-distended.  Umbilicus without lesions.  No hepatomegaly, splenomegaly or masses palpable. No evidence of hernia. Genitourinary:  External: Normal external female genitalia.  Normal urethral meatus, normal Bartholin's and Skene's glands.    Vagina: Normal vaginal mucosa, no evidence of prolapse.    Cervix: Grossly normal in appearance, no bleeding  Uterus: Non-enlarged, mobile, normal contour.  No CMT  Adnexa:  ovaries non-enlarged, no adnexal masses  Rectal: deferred Extremities: no edema, erythema, or tenderness Neurologic: Grossly intact Psychiatric: mood appropriate, affect full   Edinburgh Postnatal Depression Scale - 08/19/22 1506       Edinburgh Postnatal Depression Scale:  In the Past 7 Days   I have been able to laugh and see the funny side of things. 0    I have looked forward with enjoyment to things. 0    I have blamed myself unnecessarily when things went wrong. 1    I have been anxious or worried for no good reason. 0    I have felt scared or panicky for no good reason. 0    Things have been getting on top of me. 0    I have been so unhappy that I have had difficulty sleeping. 0    I have felt sad or miserable. 1    I have been so unhappy that I have been crying. 0    The thought of harming myself has occurred to me. 0    Edinburgh Postnatal Depression Scale Total 2             Assessment: 32 y.o. [id number] presenting for 6 week postpartum visit  Plan: Problem List Items Addressed This Visit   None Visit Diagnoses     6 weeks postpartum follow-up    -  Primary   Encounter for insertion of mirena IUD       Relevant Medications   levonorgestrel (MIRENA) 20 MCG/DAY IUD 1 each (Completed) (Start on 08/19/2022  3:45 PM)        1) Contraception - Education given regarding options for contraception, as well as compatibility with breast feeding if applicable.  Patient plans on IUD for contraception.  2)  Pap - ASCCP guidelines and rationale discussed.  Patient opts for every 3 years screening interval  3) Patient underwent screening for postpartum depression with no signs of depression  4) Return in about 4 weeks (around 09/16/2022) for IUD string check.   Rod Can, [facility] 08/19/2022, 3:36 PM

## 2022-08-19 NOTE — Progress Notes (Signed)
[facility]  GYNECOLOGY OFFICE PROCEDURE NOTE  Brandi Solomon is a 32 y.o. [id number] here for Mirena IUD insertion. No GYN concerns.  Last pap smear was in 2023 and was normal.  The patient is currently using abstinence for contraception and her LMP is No LMP recorded..  The indication for her IUD is contraception/cycle control.  IUD Insertion Procedure Note Patient identified, informed consent performed, consent signed.   Discussed risks of irregular bleeding, cramping, infection, malpositioning, expulsion or uterine perforation of the IUD (1:1000 placements)  which may require further procedure such as laparoscopy.  IUD while effective at preventing pregnancy do not prevent transmission of sexually transmitted diseases and use of barrier methods for this purpose was discussed. Time out was performed.    Speculum placed in the vagina.  Cervix visualized.  Cleaned with Betadine x 3.  Grasped anteriorly with a single tooth tenaculum.  Uterus sounded to 7 cm. IUD placed per manufacturer's recommendations.  Strings trimmed to 3 cm. Tenaculum was removed, good hemostasis noted.  Patient tolerated procedure well.   Patient was given post-procedure instructions.  She was advised to have backup contraception for one week.  Patient was also asked to check IUD strings periodically and follow up in 4-6 weeks for IUD check.  IUD insertion CPT 58300,  Skyla [id number] Mirena [id number] Liletta J7297 Paraguard J7300 Verdia Kuba [id number] Modifer 25, plus Modifer 79 is done during a global billing visit    Rod Can, [facility]  08/19/2022, 3:43 PM

## 2022-08-23 ENCOUNTER — Encounter: Payer: Self-pay | Admitting: Advanced Practice Midwife

## 2022-09-14 ENCOUNTER — Encounter: Payer: Self-pay | Admitting: Certified Nurse Midwife

## 2022-09-23 ENCOUNTER — Other Ambulatory Visit: Payer: Self-pay | Admitting: Obstetrics

## 2022-09-23 ENCOUNTER — Encounter: Payer: Self-pay | Admitting: Certified Nurse Midwife

## 2022-09-23 MED ORDER — VALACYCLOVIR HCL 500 MG PO TABS: 500.0000 mg | ORAL_TABLET | Freq: Two times a day (BID) | ORAL | 12 refills | Status: AC

## 2022-09-23 NOTE — Progress Notes (Signed)
Pt requests refill of Valacyclovir. Rx sent to pharmacy on file.  Glenetta Borg, CNM

## 2022-10-05 ENCOUNTER — Ambulatory Visit (INDEPENDENT_AMBULATORY_CARE_PROVIDER_SITE_OTHER): Payer: Medicaid Other | Admitting: Advanced Practice Midwife

## 2022-10-05 ENCOUNTER — Encounter: Payer: Self-pay | Admitting: Advanced Practice Midwife

## 2022-10-05 VITALS — BP 146/94 | HR 61 | Wt 233.6 lb

## 2022-10-05 DIAGNOSIS — Z30431 Encounter for routine checking of intrauterine contraceptive device: Secondary | ICD-10-CM | POA: Diagnosis not present

## 2022-10-05 NOTE — Progress Notes (Signed)
  [facility]  IUD String Check  Subjctive: [name] presents for IUD string check.  She had a Mirena placed 6 weeks ago.  Since placement of her IUD she had light vaginal bleeding.  She denies cramping or discomfort.  She has had intercourse since placement.  She has not checked the strings.  She denies any fever, chills, nausea, vomiting, or other complaints.    Objective: BP (!) 146/94   Pulse 61   Wt 233 lb 9.6 oz (106 kg)   BMI 32.58 kg/m  Gen:  NAD, A&Ox3 HEENT: normocephalic, anicteric Pulmonary: no increased work of breathing Pelvic: Normal appearing external female genitalia, normal vaginal epithelium, no abnormal discharge. Normal appearing cervix.  IUD strings visible and 3 cm in length similar to at the time of placement. Psychiatric: mood appropriate, affect full Neurologic: grossly normal   Assessment: 32 y.o. year old female status post prior Mirena IUD placement 6 week ago, doing well.  Plan: 1.  The patient was given instructions to check her IUD strings monthly and call with any problems or concerns.  She should call for fevers, chills, abnormal vaginal discharge, pelvic pain, or other complaints. 2.  She will return for a annual exam in 1 year.  All questions answered.   Tresea Mall, CNM 10/05/2022 3:54 PM
